# Patient Record
Sex: Female | Born: 1985 | Race: White | Hispanic: No | Smoking: Never smoker
Health system: Southern US, Community
[De-identification: ages and names within clinical notes are randomized; demographics above are authoritative.]

## PROBLEM LIST (undated history)

## (undated) DIAGNOSIS — E282 Polycystic ovarian syndrome: Secondary | ICD-10-CM

## (undated) DIAGNOSIS — I10 Essential (primary) hypertension: Secondary | ICD-10-CM

## (undated) DIAGNOSIS — J45909 Unspecified asthma, uncomplicated: Secondary | ICD-10-CM

## (undated) HISTORY — PX: TONSILLECTOMY: SUR1361

## (undated) HISTORY — PX: TUBAL LIGATION: SHX77

## (undated) HISTORY — PX: ADENOIDECTOMY: SUR15

## (undated) HISTORY — PX: TYMPANOSTOMY TUBE PLACEMENT: SHX32

---
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
## 2007-06-26 IMAGING — CT CT HEAD WITHOUT CONTRAST
2 series · 16 of 30 positions shown, 20 images · non-contrast
Comparison: none

REASON FOR EXAM: fall/head
COMMENTS:

[Series 2: without · axial · non-contrast · 0.42mm/px · z∈[-161,-41]mm · 13 of 28 slices shown, 17 images]
[im 2/28  brain]
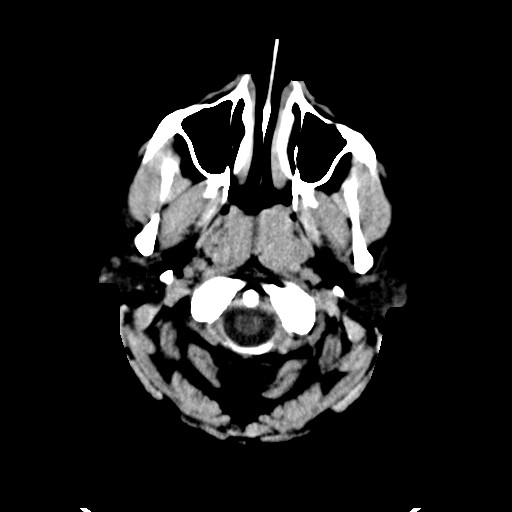
[im 2/28  bone]
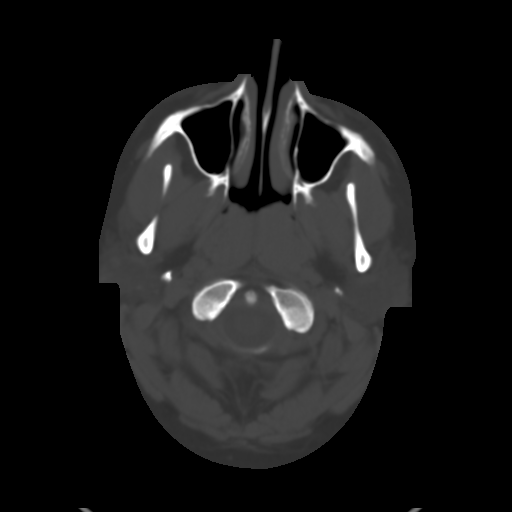
[im 4/28  brain]
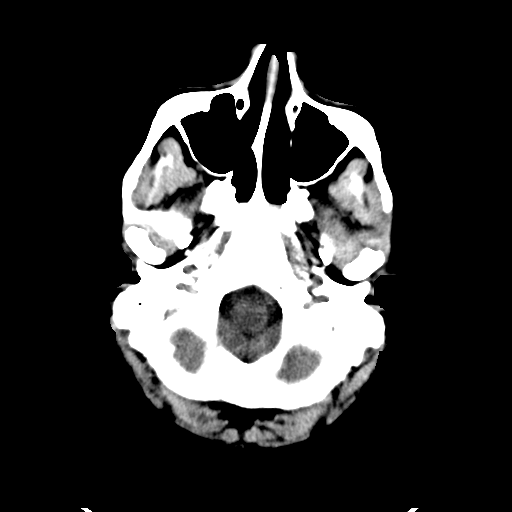
[im 6/28  brain]
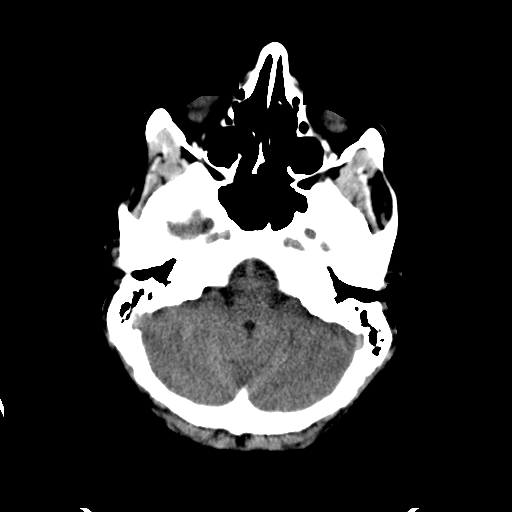
[im 8/28  brain]
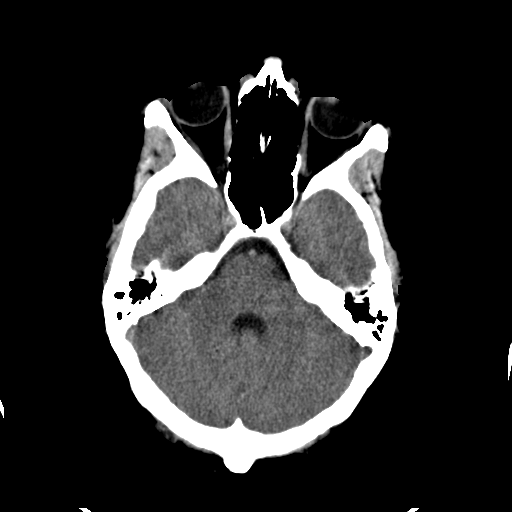
[im 10/28  brain]
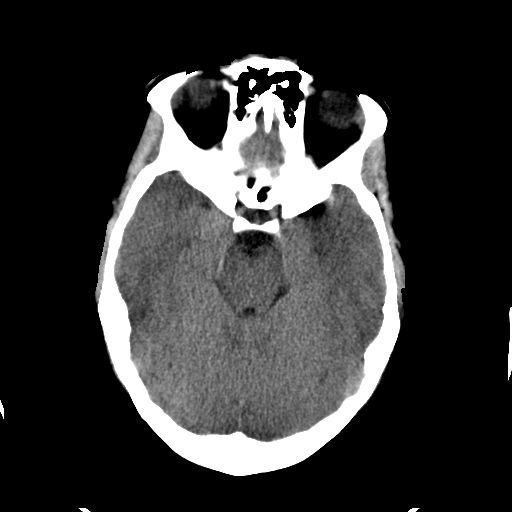
[im 10/28  bone]
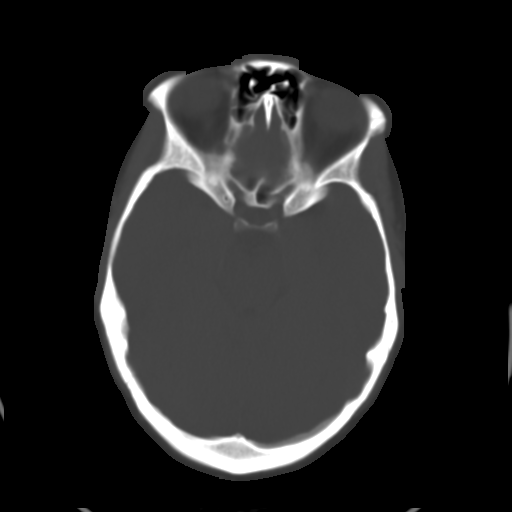
[im 12/28  brain]
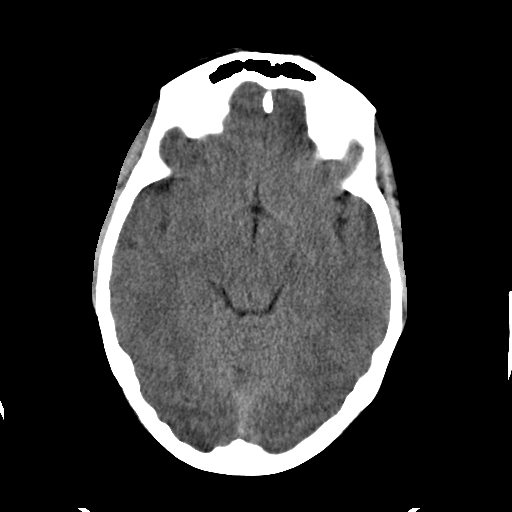
[im 14/28  brain]
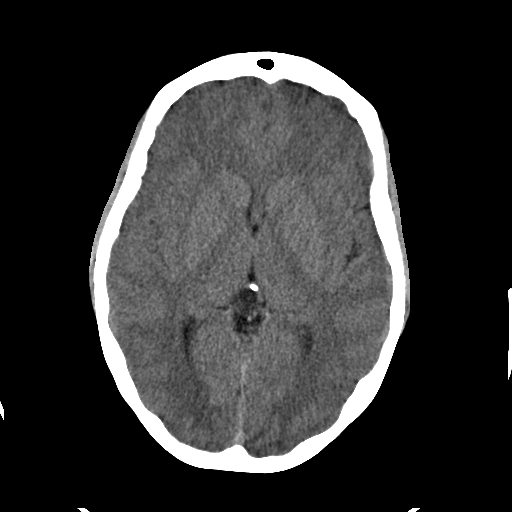
[im 16/28  brain]
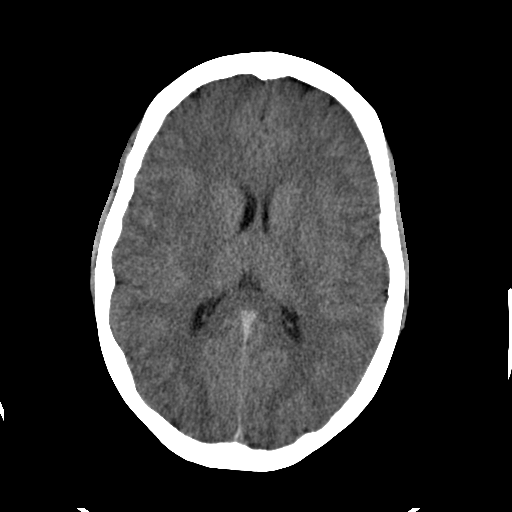
[im 18/28  brain]
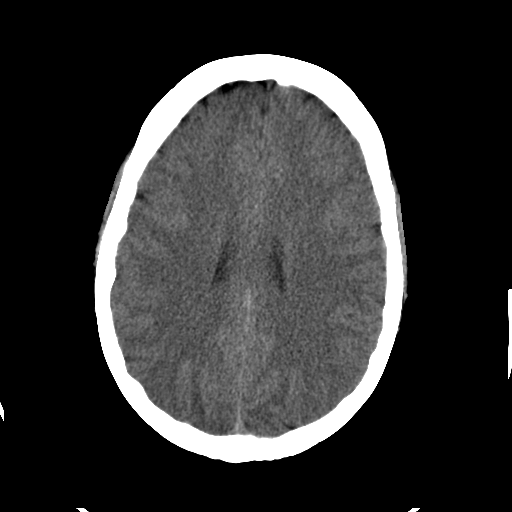
[im 18/28  bone]
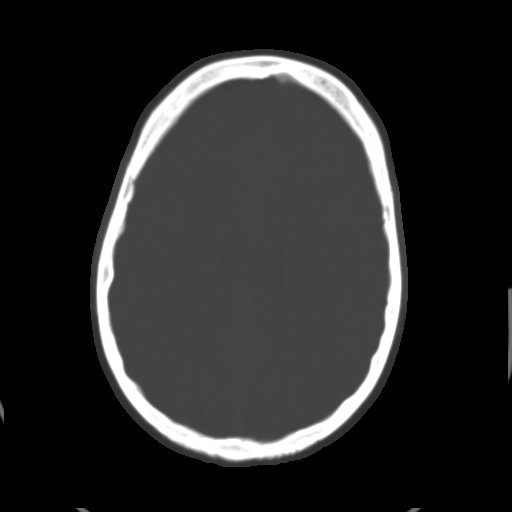
[im 20/28  brain]
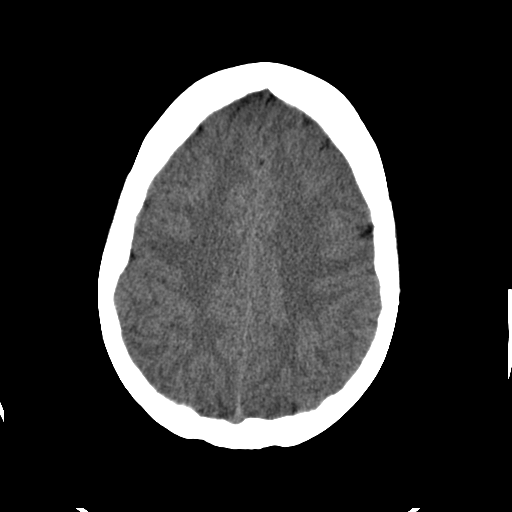
[im 22/28  brain]
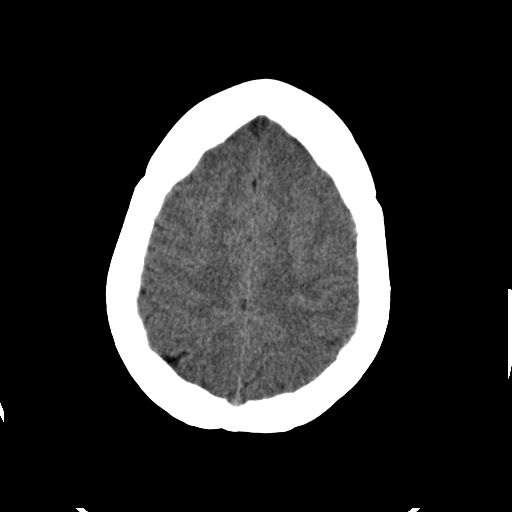
[im 24/28  brain]
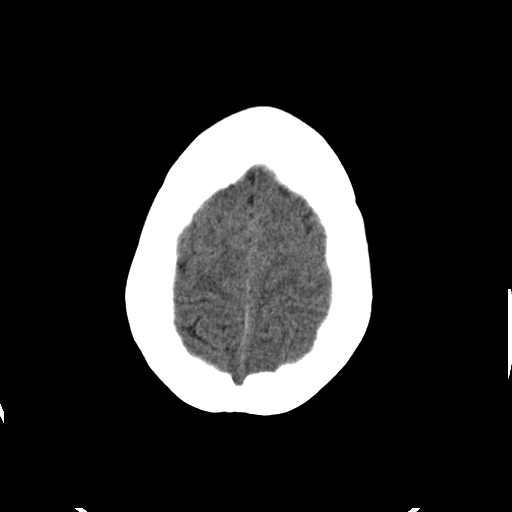
[im 26/28  brain]
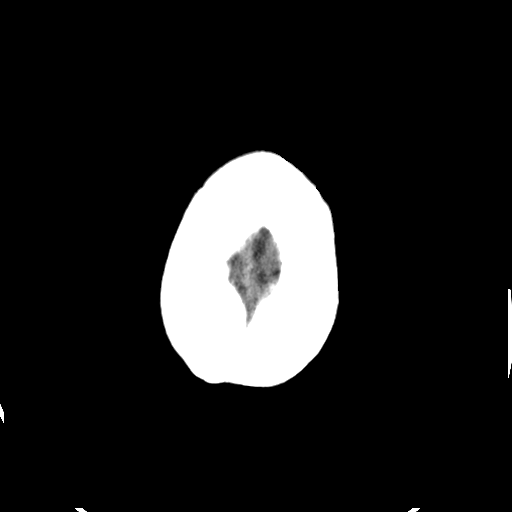
[im 26/28  bone]
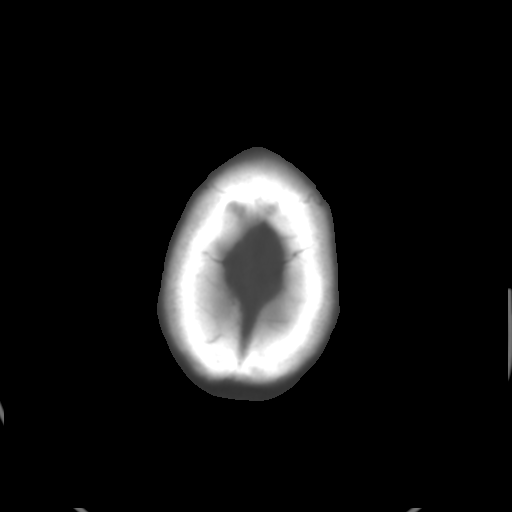

[Series 3: bone · axial · 0.42mm/px · z∈[-161,-121]mm · 3 of 28 slices shown]
[im 2/28  bone]
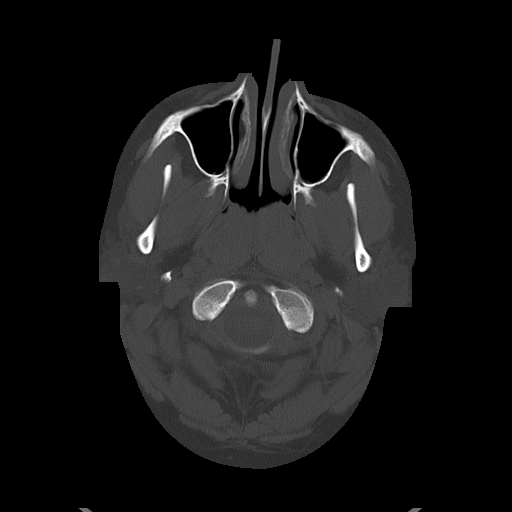
[im 6/28  bone]
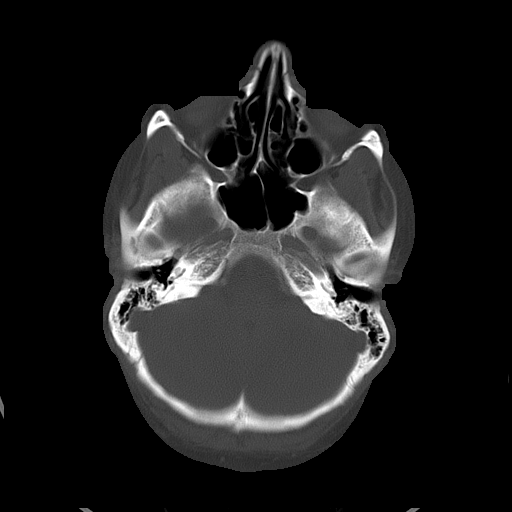
[im 10/28  bone]
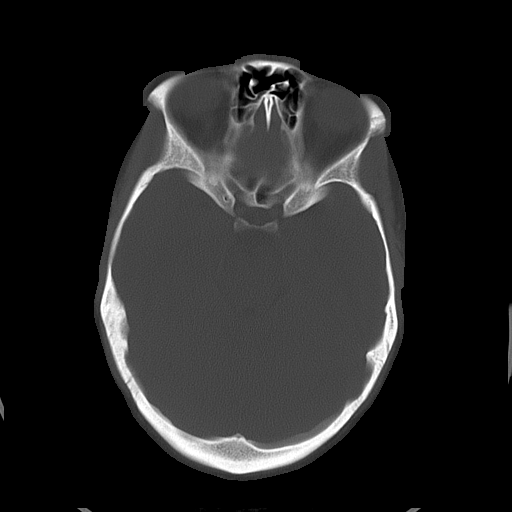

[16 of 30 positions shown; findings below may reference images not displayed]

PROCEDURE:     CT  - CT HEAD WITHOUT CONTRAST  - May 03, 2006  [DATE]

RESULT:          An unenhanced emergent head CT scan was performed status
post trauma.

No intracerebral bleeds, no contusions.  No mass effect, no shift of the
midline.  The ventricles appear within normal limits.  No subdural hematoma.

On the bone window settings, no obvious fractures are identified.
IMPRESSION: No acute abnormality is identified on the unenhanced
emergent head CT.

The report was called to the emergency room at the conclusion of the
dictation.

## 2008-11-23 ENCOUNTER — Emergency Department: Payer: Self-pay | Admitting: Emergency Medicine

## 2009-01-28 ENCOUNTER — Observation Stay: Payer: Self-pay | Admitting: Unknown Physician Specialty

## 2009-02-06 ENCOUNTER — Inpatient Hospital Stay: Payer: Self-pay | Admitting: Obstetrics and Gynecology

## 2009-03-20 ENCOUNTER — Emergency Department: Payer: Self-pay | Admitting: Unknown Physician Specialty

## 2009-07-20 ENCOUNTER — Emergency Department: Payer: Self-pay | Admitting: Emergency Medicine

## 2009-07-26 ENCOUNTER — Emergency Department: Payer: Self-pay | Admitting: Emergency Medicine

## 2009-10-29 ENCOUNTER — Emergency Department: Payer: Self-pay | Admitting: Emergency Medicine

## 2011-10-06 ENCOUNTER — Emergency Department: Payer: Self-pay | Admitting: *Deleted

## 2012-06-23 ENCOUNTER — Emergency Department: Payer: Self-pay | Admitting: Emergency Medicine

## 2012-06-23 LAB — URINALYSIS, COMPLETE
Bilirubin,UR: NEGATIVE
Glucose,UR: NEGATIVE mg/dL (ref 0–75)
Nitrite: NEGATIVE
Specific Gravity: 1.026 (ref 1.003–1.030)
Squamous Epithelial: 33
WBC UR: 13 /HPF (ref 0–5)

## 2012-06-23 LAB — CBC WITH DIFFERENTIAL/PLATELET
Basophil #: 0 10*3/uL (ref 0.0–0.1)
HCT: 42.1 % (ref 35.0–47.0)
HGB: 14.5 g/dL (ref 12.0–16.0)
Lymphocyte #: 1.2 10*3/uL (ref 1.0–3.6)
Lymphocyte %: 7.3 %
MCH: 28.9 pg (ref 26.0–34.0)
MCV: 84 fL (ref 80–100)
Monocyte #: 0.4 x10 3/mm (ref 0.2–0.9)
Monocyte %: 2.4 %
Platelet: 353 10*3/uL (ref 150–440)
RBC: 5.01 10*6/uL (ref 3.80–5.20)
RDW: 13.5 % (ref 11.5–14.5)
WBC: 16.6 10*3/uL — ABNORMAL HIGH (ref 3.6–11.0)

## 2012-06-23 LAB — BASIC METABOLIC PANEL
Chloride: 109 mmol/L — ABNORMAL HIGH (ref 98–107)
EGFR (Non-African Amer.): >60
Glucose: 110 mg/dL — ABNORMAL HIGH (ref 65–99)
Osmolality: 279 (ref 275–301)
Potassium: 4 mmol/L (ref 3.5–5.1)

## 2012-06-23 LAB — PREGNANCY, URINE: Pregnancy Test, Urine: NEGATIVE m[IU]/mL

## 2013-04-21 ENCOUNTER — Emergency Department: Payer: Self-pay | Admitting: Emergency Medicine

## 2013-04-21 LAB — CBC
HCT: 41.7 % (ref 35.0–47.0)
MCH: 28.8 pg (ref 26.0–34.0)
Platelet: 366 10*3/uL (ref 150–440)
RBC: 4.96 10*6/uL (ref 3.80–5.20)
RDW: 13.6 % (ref 11.5–14.5)
WBC: 9.2 10*3/uL (ref 3.6–11.0)

## 2013-04-21 LAB — URINALYSIS, COMPLETE
Blood: NEGATIVE
Ketone: NEGATIVE
Nitrite: NEGATIVE
Protein: NEGATIVE
RBC,UR: 1 /HPF (ref 0–5)
Squamous Epithelial: 3

## 2013-04-21 LAB — COMPREHENSIVE METABOLIC PANEL
Albumin: 3.6 g/dL (ref 3.4–5.0)
Alkaline Phosphatase: 83 U/L (ref 50–136)
BUN: 9 mg/dL (ref 7–18)
Bilirubin,Total: 0.5 mg/dL (ref 0.2–1.0)
Chloride: 107 mmol/L (ref 98–107)
Co2: 27 mmol/L (ref 21–32)
EGFR (African American): >60
EGFR (Non-African Amer.): >60
Osmolality: 273 (ref 275–301)
SGOT(AST): 16 U/L (ref 15–37)
SGPT (ALT): 23 U/L (ref 12–78)
Sodium: 137 mmol/L (ref 136–145)

## 2013-04-21 LAB — LIPASE, BLOOD: Lipase: 123 U/L (ref 73–393)

## 2013-08-11 ENCOUNTER — Emergency Department: Payer: Self-pay | Admitting: Emergency Medicine

## 2013-09-06 ENCOUNTER — Emergency Department: Payer: Self-pay | Admitting: Emergency Medicine

## 2014-07-25 ENCOUNTER — Emergency Department: Payer: Self-pay | Admitting: Emergency Medicine

## 2014-07-25 LAB — COMPREHENSIVE METABOLIC PANEL
ALBUMIN: 2.9 g/dL — AB (ref 3.4–5.0)
ALK PHOS: 58 U/L
AST: 18 U/L (ref 15–37)
Anion Gap: 11 (ref 7–16)
BUN: 7 mg/dL (ref 7–18)
Bilirubin,Total: 0.5 mg/dL (ref 0.2–1.0)
CO2: 20 mmol/L — AB (ref 21–32)
Calcium, Total: 9.7 mg/dL (ref 8.5–10.1)
Chloride: 109 mmol/L — ABNORMAL HIGH (ref 98–107)
Creatinine: 0.76 mg/dL (ref 0.60–1.30)
EGFR (African American): >60
Glucose: 101 mg/dL — ABNORMAL HIGH (ref 65–99)
Osmolality: 278 (ref 275–301)
POTASSIUM: 4 mmol/L (ref 3.5–5.1)
SGPT (ALT): 17 U/L
SODIUM: 140 mmol/L (ref 136–145)
Total Protein: 6.7 g/dL (ref 6.4–8.2)

## 2014-07-25 LAB — CBC
HCT: 38.6 % (ref 35.0–47.0)
HGB: 12.6 g/dL (ref 12.0–16.0)
MCH: 28 pg (ref 26.0–34.0)
MCHC: 32.7 g/dL (ref 32.0–36.0)
MCV: 86 fL (ref 80–100)
Platelet: 285 10*3/uL (ref 150–440)
RBC: 4.5 10*6/uL (ref 3.80–5.20)
RDW: 13.9 % (ref 11.5–14.5)
WBC: 12.2 10*3/uL — ABNORMAL HIGH (ref 3.6–11.0)

## 2014-07-25 LAB — URINALYSIS, COMPLETE
BILIRUBIN, UR: NEGATIVE
Bacteria: NONE SEEN
GLUCOSE, UR: NEGATIVE mg/dL (ref 0–75)
Nitrite: NEGATIVE
Ph: 5 (ref 4.5–8.0)
Protein: 75
SPECIFIC GRAVITY: 1.02 (ref 1.003–1.030)
Squamous Epithelial: 65
WBC UR: 26 /HPF (ref 0–5)

## 2014-07-27 ENCOUNTER — Emergency Department: Payer: Self-pay | Admitting: Emergency Medicine

## 2014-07-27 LAB — URINALYSIS, COMPLETE
BILIRUBIN, UR: NEGATIVE
BLOOD: NEGATIVE
GLUCOSE, UR: NEGATIVE mg/dL (ref 0–75)
Nitrite: NEGATIVE
PROTEIN: NEGATIVE
Ph: 5 (ref 4.5–8.0)
Specific Gravity: 1.017 (ref 1.003–1.030)
Squamous Epithelial: 11
WBC UR: 3 /HPF (ref 0–5)

## 2014-09-20 ENCOUNTER — Ambulatory Visit: Payer: Self-pay | Admitting: Obstetrics and Gynecology

## 2014-12-28 ENCOUNTER — Inpatient Hospital Stay: Payer: Self-pay | Admitting: Obstetrics and Gynecology

## 2014-12-28 LAB — URINALYSIS, COMPLETE
BILIRUBIN, UR: NEGATIVE
Blood: NEGATIVE
Glucose,UR: NEGATIVE mg/dL (ref 0–75)
KETONE: NEGATIVE
Leukocyte Esterase: NEGATIVE
Nitrite: NEGATIVE
PH: 6 (ref 4.5–8.0)
Protein: 100
RBC,UR: 2 /HPF (ref 0–5)
SPECIFIC GRAVITY: 1.016 (ref 1.003–1.030)
WBC UR: 9 /HPF (ref 0–5)

## 2014-12-28 LAB — PIH PROFILE
ANION GAP: 6 — AB (ref 7–16)
BUN: 8 mg/dL
CREATININE: 0.63 mg/dL
Calcium, Total: 9.3 mg/dL
Chloride: 110 mmol/L
Co2: 23 mmol/L
EGFR (African American): >60
Glucose: 97 mg/dL
HCT: 26.1 % — AB (ref 35.0–47.0)
HGB: 7.9 g/dL — AB (ref 12.0–16.0)
MCH: 21.3 pg — AB (ref 26.0–34.0)
MCHC: 30.1 g/dL — AB (ref 32.0–36.0)
MCV: 71 fL — ABNORMAL LOW (ref 80–100)
Platelet: 269 10*3/uL (ref 150–440)
Potassium: 3.6 mmol/L
RBC: 3.69 10*6/uL — ABNORMAL LOW (ref 3.80–5.20)
RDW: 17.3 % — ABNORMAL HIGH (ref 11.5–14.5)
SGOT(AST): 14 U/L — ABNORMAL LOW
Sodium: 139 mmol/L
Uric Acid: 7.6 mg/dL — ABNORMAL HIGH
WBC: 6.8 10*3/uL (ref 3.6–11.0)

## 2014-12-28 LAB — PROTEIN / CREATININE RATIO, URINE
Creatinine, Urine: 171 mg/dL — ABNORMAL HIGH (ref 30.0–125.0)
Protein, Random Urine: 86 mg/dL — ABNORMAL HIGH (ref 0–12)
Protein/Creat. Ratio: 503 mg/gCREAT — ABNORMAL HIGH (ref 0–200)

## 2014-12-29 LAB — BASIC METABOLIC PANEL WITH GFR
Anion Gap: 5 — ABNORMAL LOW
BUN: 6 mg/dL
Calcium, Total: 8 mg/dL — ABNORMAL LOW
Chloride: 107 mmol/L
Co2: 22 mmol/L
Creatinine: 0.61 mg/dL
EGFR (African American): >60
EGFR (Non-African Amer.): >60
Glucose: 100 mg/dL — ABNORMAL HIGH
Potassium: 3.9 mmol/L
Sodium: 134 mmol/L — ABNORMAL LOW

## 2014-12-29 LAB — SGOT (AST)(ARMC)
SGOT(AST): 17 U/L
SGOT(AST): 16 U/L

## 2014-12-29 LAB — BASIC METABOLIC PANEL
Anion Gap: 6 — ABNORMAL LOW (ref 7–16)
BUN: 7 mg/dL
CALCIUM: 8.1 mg/dL — AB
Chloride: 105 mmol/L
Co2: 22 mmol/L
Creatinine: 0.65 mg/dL
EGFR (African American): >60
EGFR (Non-African Amer.): >60
Glucose: 100 mg/dL — ABNORMAL HIGH
Potassium: 3.6 mmol/L
Sodium: 133 mmol/L — ABNORMAL LOW

## 2014-12-29 LAB — PLATELET COUNT
Platelet: 273 x10 3/mm 3
Platelet: 286 10*3/uL (ref 150–440)

## 2014-12-29 LAB — URIC ACID
Uric Acid: 7.6 mg/dL — ABNORMAL HIGH
Uric Acid: 7.6 mg/dL — ABNORMAL HIGH

## 2014-12-29 LAB — RBC
RBC: 3.74 10*6/uL — AB (ref 3.80–5.20)
RBC: 3.85 10*6/uL (ref 3.80–5.20)

## 2014-12-29 LAB — WBC
WBC: 11 x10 3/mm 3
WBC: 11.5 10*3/uL — AB (ref 3.6–11.0)

## 2014-12-29 LAB — CREATININE, URINE, RANDOM: Creatinine, Urine Random: 91 mg/dL (ref 30–125)

## 2014-12-29 LAB — HEMATOCRIT: HCT: 26.2 % — AB (ref 35.0–47.0)

## 2014-12-29 LAB — HEMOGLOBIN: HGB: 7.9 g/dL — AB (ref 12.0–16.0)

## 2014-12-30 LAB — BETA STREP CULTURE(ARMC)

## 2015-01-02 LAB — COMPREHENSIVE METABOLIC PANEL
ALK PHOS: 85 U/L
ALT: 18 U/L
Albumin: 2.1 g/dL — ABNORMAL LOW
Anion Gap: 8 (ref 7–16)
BILIRUBIN TOTAL: 0.2 mg/dL — AB
BUN: 10 mg/dL
CREATININE: 0.54 mg/dL
Calcium, Total: 9.2 mg/dL
Chloride: 106 mmol/L
Co2: 25 mmol/L
EGFR (African American): >60
EGFR (Non-African Amer.): >60
Glucose: 83 mg/dL
Potassium: 3.8 mmol/L
SGOT(AST): 20 U/L
Sodium: 139 mmol/L
Total Protein: 5.5 g/dL — ABNORMAL LOW

## 2015-01-29 LAB — SURGICAL PATHOLOGY

## 2015-02-04 NOTE — Consult Note (Signed)
Brief Consult Note: Diagnosis: 29 yr old female with preeclampsia.has urgent C section ,now has Post partum  hypertension,despite 2 antihypertensives.   Patient was seen by consultant.   Consult note dictated.   Recommend further assessment or treatment.   Orders entered.   Comments: 29 yr old morbidly obese female with preeclampsia,developed post partum hypertension;had emergency C section  on 25th.having elevated bp with 180-/100.no evidence of HELp or renal failure.no headache or pedal edema. recommend continuing same dose labetalol,increased Norvasc,added Hydrallazine. recommend d/c fluids ,NSAID in pt with (post partum Hypertension,can increase stroke risk) can progress to chronic hypertension,needs close follo up,.  Electronic Signatures: Katha HammingKonidena, Lilliona Blakeney (MD)  (Signed 28-Mar-16 18:22)  Authored: Brief Consult Note   Last Updated: 28-Mar-16 18:22 by Katha HammingKonidena, Sutton Plake (MD)

## 2015-02-04 NOTE — Op Note (Signed)
PATIENT NAME:  Alicia Blevins, Alicia Blevins MR#:  [id number] DATE OF BIRTH:  17-Jun-1986  DATE OF PROCEDURE:  12/29/2014  PREOPERATIVE DIAGNOSES:  1.  Previous Cesarean section x 1, desiring repeat.  2.  Multiparity, desiring permanent sterilization.  3.  Morbid obesity.  4.  Severe preeclampsia.  5.  Twin gestation, dichorionic, diamnionic at 41 and [redacted] weeks gestation.  6.  Severe anemia with pre-operative hemoglobin of 7.9.   POSTOPERATIVE DIAGNOSES:  1.  Previous Cesarean section x 1, desiring repeat.  2.  Multiparity, desiring permanent sterilization.  3.  Morbid obesity.  4.  Severe preeclampsia.  5.  Twin gestation, dichorionic, diamnionic at 55 and [redacted] weeks gestation.  6.  Severe anemia with pre-operative hemoglobin of 7.9.   PROCEDURE:  Repeat low transverse Cesarean section with bilateral tubal ligation.   ANESTHESIA: Epidural.   SURGEON:  Lucette Kratz S. Valentino Saxon, MD.    Threasa HeadsPrentice Docker. DeFrancesco, MD.   Bronwen Betters BLOOD LOSS: 500 mL.   OPERATIVE FLUIDS: 1200 mL.   URINE OUTPUT: 20 mL.    COMPLICATIONS: None.   FINDINGS:  There was patient with large pannus with faint previous C-section scar. There was a thick scar band at the level of fascia.  There were a few filmy adhesions of the peritoneum to the uterus.  There was a thin lower uterine segment. Twin A was female, cephalic, and ROA position with nuchal cord that was loose and reducible, at 2510 grams, with Apgars of 8 and 9, and twin B was footling breech with nuchal cord, 2500 grams, with Apgars of 9 and 9, also with nuchal cord that was loose and reducible. There was clear amniotic fluid at rupture x 2. There was normal-appearing uterine outline, fallopian tubes and ovaries.   SPECIMEN: There were none.   CONDITION:  Stable.   DESCRIPTION OF PROCEDURE:  The patient was taken to the operating room where she was placed under epidural anesthesia after there were difficulties with obtaining spinal anesthesia. She was then placed in  the supine position with leftward tilt and was prepped and draped in normal sterile fashion. A Foley catheter was placed. The epidural was then tested and found to be adequate. 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 The patient received approximately 3 grams of clindamycin as she had an allergy to penicillin.  A Pfannenstiel incision was made and carried down through the subcutaneous tissue to the fascia with the Bovie. The fascia was then incised in the midline and the incision was extended laterally using the Bovie. The superior aspect of the fascial incision was then grasped with the Kocher clamps, elevated, and underlying rectus tissue was dissected superiorly with the scalpel. Attention was then turned inferiorly, which in a similar fashion was grasped, tented up with Kocher clamps, and the rectus muscles were dissected off using the Mayo scissors. There was no clearly identifiable midline that was noted in the rectus muscles, therefore the belly of the muscle was entered in what was presumed to be the midline. The peritoneum was identified, tented up, and entered sharply with Metzenbaum scissors. The peritoneal incision was then extended superiorly and inferiorly with good visualization of the bladder. There were a few filmy adhesions of the peritoneum to the uterus which were taken down sharply. Next the uterovesical peritoneal reflection was incised transversely and the bladder flap was bluntly freed from the lower uterine segment. The bladder blade was then  inserted. A low transverse uterine incision was made along the lower uterine segment with the scalpel. The lower uterine segment was noted to be very thin, almost a peritoneal window. The uterine incision was then extended laterally and superiorly bluntly. The bladder blade was removed. The amniotic sac was ruptured and the infant's head was  delivered atraumatically. There was a nuchal cord that was reduced. Nose and mouth were suctioned, the infant was handed off to the pediatricians. Delivered from vertex presentation was a 2510 gram female with Apgars of 8 and 9 at 1 and 5 minutes respectively. After the umbilical cord was clamped and cut the second amniotic sac was ruptured and the twin B was delivered atraumatically via breech extraction as the infant was noted to be footling breech. Twin B was 2500 grams with Apgars of 9 and 9 at 1 minute and 5 minutes respectively. The umbilical cord of twin B was then clamped and cut. Next the placenta was removed manually and intact and appeared normal. The uterus was exteriorized and cleared of all clots and debris. Uterine outline, tubes and ovaries appeared normal. The uerine incision was then closed with a running locked suture of 0 Vicryl. There was still some oozing noted from the bladder flap and so the bladder flap was then reapproximated using a 2-0 Vicryl. Hemostasis was achieved.   Attention was turned to the right fallopian tube which was then identified and grasped with Babcock clamp. The tube was then followed out to the fimbriated edge. The Babcock clamp was then used to grasp the tube approximately 4 cm from the cornual region. A 3 cm segment of tube was then ligated with a free tie of plain gut using the Parkland technique and excised. The left fallopian tube was then ligated in a similar fashion and excised. Next the uterus was returned to the abdomen. The gutters were cleared of all clots and debris. Lavage was carried out until clear.    the trocars for the On-Q pumps were inserted through a small skin incision that traversed from the skin through the level of the fascia, and the tubing was placed lateral to each side of the incision. The fascia was then closed with running sutures of 1-0 Vicryl from both directions. The subcutaneous fat was reapproximated in 2 layers using 3-0 Vicryl in  running fashion. The skin was then approximated using 4-0 Monocryl. The patient received a bolus dose of 5 mL of bupivacaine in each tubing that was placed on the left and right sides of the incision.  The patient tolerated the procedure well. She was taken to the PACU in stable condition. Both infants were taken to the recovery room as well.  Instrument, sponge, and needle counts were correct x 2 at the end of the procedure. The patient will receive 2 more doses of clindamycin postpartum q 8 hours for 24 hours due to morbid obesity and greater than 1 hour passing from dose to incision time. She will be started on magnesium sulfate postpartum 4 gram load and 2 gram maintenance.    ____________________________ Jacques EarthlyAnika S. Valentino Saxonherry, MD asc:bu D: 12/29/2014 10:45:02 ET T: 12/29/2014 14:37:35 ET JOB#: [id number]  cc: Jacques EarthlyAnika S. Valentino Saxonherry, MD, <Dictator> Fabian NovemberANIKA S Janequa Kipnis MD ELECTRONICALLY SIGNED 01/15/2015 12:28

## 2015-02-04 NOTE — Consult Note (Addendum)
PATIENT NAME:  Alicia Blevins, Alicia Blevins MR#:  [id number] DATE OF BIRTH:  January 28, 1986  DATE OF CONSULTATION:  01/01/2015  REFERRING PHYSICIAN:  Anika S. Valentino Saxon, MD  CONSULTING PHYSICIAN:  Katha Hamming, MD  PRIMARY DOCTOR:  Nonlocal.    REASON FOR CONSULT:  Management of hypertension.    HISTORY OF PRESENT ILLNESS: A 29 year old female who was gravida 2, para 1 admitted to GYN service for preeclampsia and (post partum hypertension.. The patient's blood pressure in the office was 160s/100 with proteinuria so patient (is  admitted   OB/GYN service, had emergency cesarean section on March 25. The patient had severe preeclampsia, and she received labetalol and magnesium and then had emergency  cesarean section. However, patient had postpartum hypertension, persisted, and patient's blood pressure has been elevated with recent blood pressure 186/107. The patient is on labetalol at 400 mg b.i.d. and amlodipine 5 mg daily but patient's blood pressure has been high at 160/100. Concerning this we were asked to see the patient. The patient never had hypertension before and had preeclampsia and was evaluated at GYN office and admitted and then had emergency cesarean section. The patient has no end-organ damage at this time with no HELLP syndrome, no renal failure. The patient has very poor IV access and gyn  has ordered IV hydralazine 10 mg one dose for highly elevated blood pressure of 186/107 but patient is very anxious and she says she gets panic attacks whenever IV is inserted and she does not want IV to be placed, so I ordered hydralazine 50 mg t.i.d. in addition to her labetalol and Norvasc. The patient denies chest pain, shortness of breath, headache. No extremity edema.   PAST MEDICAL HISTORY: No hypertension, diabetes. The patient is obese with BMI of 54.8. The patient has a healthy child who is 51 years old now.    ALLERGIES: AMOXICILLIN, PENICILLIN, TRAMADOL.   SOCIAL HISTORY: Lives with husband. No  smoking, no drinking.   SURGICAL HISTORY: Cesarean section before and this is second cesarean.   FAMILY HISTORY: Mother has high blood pressure, grandmother has high blood pressure.   PRESENT MEDICATIONS: Labetalol 400 mg b.i.d., amlodipine 5 mg daily, hydralazine 50 mg t.i.d. The patient is on prenatal vitamins and Milk of Magnesia as needed for constipation, , Toradol 30 mg IV q. 6 for pain, Zofran and also ibuprofen 800 mg q. 8 p.r.n. for mild pain. The patient is also on ringer lactate at 50 mL per hour.   REVIEW OF SYSTEMS:  CONSTITUTIONAL: No fever. No fatigue.  EYES: No blurred vision.  EARS, NOSE, AND THROAT: No tinnitus. No ear pain. No epistaxis.   RESPIRATIONS: No cough. No shortness of breath.  CARDIOVASCULAR: No chest pain. No orthopnea. No pedal edema.  GASTROINTESTINAL: No nausea, no vomiting. Has some abdominal cramps on and off.  GENITOURINARY: No dysuria.  ENDOCRINE: No polyuria or nocturia.  HEMATOLOGIC: The patient has no chronic anemia problems.  MUSCULOSKELETAL: No joint pain.  NEUROLOGIC: No numbness or weakness or dysarthria.  PSYCHIATRIC: The patient is anxious because of her IV access but otherwise no problems.   LABORATORY DATA: Glucose 100, BUN 6, creatinine , glucose 134, potassium 3.9, chloride 107, bicarbonate 22. The patient's calcium 8, uric acid 7.6, SGOT 16. The patient's platelets are 286,000. These labs are done on 25th.    PHYSICAL EXAMINATION:  VITALS: Temperature 98.2, heart rate 78, blood pressure fluctuating between 160/100 to 186/107. The patient's blood pressure had been within acceptable limits yesterday but it has been  high since this afternoon.   HEENT:  PERRLA. EOMs intact. No scleral icterus. No conjunctivitis. Hearing is intact. No pharyngeal erythema.  NECK:  No thyroid enlargement. No JVD. No carotid bruit. Normal range of motion.  RESPIRATION: Clear to auscultation. Breath sounds are present bilaterally.  CARDIOVASCULAR: S1, S2  regular. No murmurs. The patient has good pedal pulse >. No extremity edema.  ABDOMEN: Soft. Bowel sounds present.  EXTREMITIES: No extremity edema. No cyanosis. No clubbing.  SKIN: No skin rashes. No erythema, no nodules.  MUSCULOSKELETAL: Five/5 strength upper and lower extremities. No kyphosis.   NEUROLOGIC: Cranial nerves II through XII are intact. DTRs 2+ bilaterally. No ( focal deficit. or aphasia.  PSYCHIATRIC: Oriented to time, place, person.   ASSESSMENT AND PLAN:  411.  A 29 year old female patient with severe preeclampsia now has postpartum hypertension. She is already on p.o. labetalol and Norvasc and the patient's blood pressure is still uncontrolled. She has no end-organ damage at this time. I recommend continuing the labetalol at 400 mg b.i.d., continue amlodipine but we can increase amlodipine to 10 mg daily, plus add the hydralazine 50 mg t.i.d. These medications are safe even for breast feeding, so we will up titrate the medications as needed.  2.  Regarding fluid status, the patient is well hydrated. Recommend discontinuing fluids, recommend discontinuing iV fluids with RL.. The patient's  goal blood pressure is about 140 to 150/90. The patient needs close follow up to see if she develops any chronic hypertension.  3.  Morbid obesity, (counselled  against > weight loss to help her with hypertension.  The patient can have a small dose of diuretic to help her with high blood pressure if needed, can have hydrochlorothiazide 25 mg daily for a few days to help with postpartum hypertension.   TIME SPENT: About 55 minutes.   Thanks for asking us to see this patient. We will follow up on the blood pressure.    ____________________________ Katha HammingSnehalatha Terryon Pineiro, MD sk:AT D: 01/01/2015 18:10:00 ET T: 01/01/2015 23:53:45 ET JOB#: [id number]  cc: Katha HammingSnehalatha Sabreena Vogan, MD, <Dictator> Katha HammingSNEHALATHA Makailey Hodgkin MD ELECTRONICALLY SIGNED 02/06/2015 12:36

## 2015-02-13 NOTE — H&P (Signed)
L&D Evaluation:  History:  HPI [name] is a 29 y.o. G2P1001 single Caucasian female with di-di twin gestation who presented for workup of PIH.  BP in office 160s/100s, with 4+ protenuria on UA dipstick. Denies headache, visual changes, abdominal pain.  Does report leg swelling over past 2-3 days.   Presents with elevated BPs   Patient's Medical History Migraines, morbid obesity,   Patient's Surgical History Previous C-Section  x 1   Medications Pre Natal Vitamins  Iron  Percocet (for migraines) prn.   Allergies PCN, Tramadol, red dye   Social History tobacco  h/o smoking prior to pregnancy   Family History migraines in several family members   ROS:  General normal   HEENT normal   CNS normal   GI normal   GU normal   Resp normal   CV normal   Renal normal   MS bilateral swelling in feet x 2-3 days   Exam:  Vital Signs BPs 140s-170s/90s-100s   Urine Protein 3+   General no apparent distress   Mental Status clear   Chest clear   Heart normal sinus rhythm, no murmur/gallop/rubs   Abdomen gravid, non-tender   Estimated Fetal Weight Average for gestational age   Fetal Position cephalic   Fundal Height 39   Back no CVAT   Edema 1+  Pitting  in ankles and feet   Reflexes 2+   Clonus negative   Pelvic no external lesions, cervix closed and thick   Mebranes Intact   FHT normal rate with no decels   FHT Description Twin A 145, Twin B 152   Ucx irregular, q 3-10 min, not detectable to patient.   Skin dry, no lesions, no rashes   Lymph no lymphadenopathy   Other A+/-/HIV-/ND/NR/RI/VZI/GBS unknown.  GC/Cl to be performed today, however on NOB labs was negative.   Early and repeat glucola wnl. [facility] genetic testing normal.  12/28/14: 6.8>7.9/26.1<269   Impression:  Impression evaluation for PIH, reactive NST, Twin gestatio, morbid obesity, h/o C-section x 1, severe anemia of pregnancy   Plan:  Plan PIH panel, fluids    Comments Patient with severe range BPs, proteinuria.  Current diagnosis of pre-eclampsia with severe features.  Currently without headaches, vision changes or pain.  Liver enzymes and platelets wnl, uric acid is 7.6.  PC ratio is 503.  Discussion had with patient regarding diagnosis and need for delivery.  Patient ok with plan.  Will initiate Labetalol for BPs, can begin Magnesium Sulfate post-operatively as will be proceeding to C-section within the next hour. Anemia in pregnancy - severe.  Will type and cross for 3 units PRBCs as patient may require blood transfusion postpartum. To collect thrid trimester labs.   Electronic Signatures: Fabian Novemberherry, Star Cheese S (MD)  (Signed 24-Mar-16 18:10)  Authored: L&D Evaluation   Last Updated: 24-Mar-16 18:10 by Fabian Novemberherry, Stanton Kissoon S (MD)

## 2015-04-05 ENCOUNTER — Emergency Department
Admission: EM | Admit: 2015-04-05 | Discharge: 2015-04-05 | Disposition: A | Discharge status: Home / Self Care | Payer: Medicaid Other | Attending: Emergency Medicine | Admitting: Emergency Medicine

## 2015-04-05 ENCOUNTER — Encounter: Payer: Self-pay | Admitting: Intensive Care

## 2015-04-05 DIAGNOSIS — I10 Essential (primary) hypertension: Secondary | ICD-10-CM | POA: Diagnosis not present

## 2015-04-05 DIAGNOSIS — Z880 Allergy status to penicillin: Secondary | ICD-10-CM | POA: Diagnosis not present

## 2015-04-05 DIAGNOSIS — L409 Psoriasis, unspecified: Secondary | ICD-10-CM | POA: Diagnosis not present

## 2015-04-05 DIAGNOSIS — L02411 Cutaneous abscess of right axilla: Secondary | ICD-10-CM | POA: Diagnosis present

## 2015-04-05 DIAGNOSIS — L732 Hidradenitis suppurativa: Secondary | ICD-10-CM | POA: Insufficient documentation

## 2015-04-05 HISTORY — DX: Unspecified asthma, uncomplicated: J45.909

## 2015-04-05 HISTORY — DX: Essential (primary) hypertension: I10

## 2015-04-05 MED ORDER — SULFAMETHOXAZOLE-TRIMETHOPRIM 800-160 MG PO TABS: 1.0000 | ORAL_TABLET | Freq: Two times a day (BID) | ORAL | Status: DC

## 2015-04-05 MED ORDER — HYDROCODONE-ACETAMINOPHEN 5-325 MG PO TABS
1.0000 | ORAL_TABLET | ORAL | Status: DC | PRN
Start: 2015-04-05 — End: 2019-09-03

## 2015-04-05 MED ORDER — CALCIPOTRIENE 0.005 % EX SOLN: 1.0000 | Freq: Two times a day (BID) | CUTANEOUS | Status: DC

## 2015-04-05 NOTE — ED Provider Notes (Signed)
[facility] Emergency Department Provider Note ____________________________________________  Time seen: Approximately 6:16 PM  I have reviewed the triage vital signs and the nursing notes.   HISTORY  Chief Complaint Abscess  HPI Alicia Blevins is a 29 y.o. female who presents to the emergency department for an abscess under her right arm. She states she has had the same area lanced about a year ago.She is also here for severe dandruff that has not been controlled with Head and Shoulders or T-Gel. She had twins about 3 months ago and everything started after that.  Past Medical History  Diagnosis Date  . Hypertension   . Asthma     There are no active problems to display for this patient.   Past Surgical History  Procedure Laterality Date  . Tubal ligation      Current Outpatient Rx  Name  Route  Sig  Dispense  Refill  . Calcipotriene 0.005 % solution   Topical   Apply 1 Dose topically 2 (two) times daily. Brush hair, apply solution to lesions and rub in. Avoid uninvolved skin. Do not use for longer than 8 weeks.   60 mL   0   . HYDROcodone-acetaminophen (NORCO/VICODIN) 5-325 MG per tablet   Oral   Take 1 tablet by mouth every 4 (four) hours as needed for moderate pain.   6 tablet   0   . sulfamethoxazole-trimethoprim (BACTRIM DS,SEPTRA DS) 800-160 MG per tablet   Oral   Take 1 tablet by mouth 2 (two) times daily.   20 tablet   0     Allergies Amoxicillin; Penicillins; Red dye; and Tramadol  History reviewed. No pertinent family history.  Social History History  Substance Use Topics  . Smoking status: Never Smoker   . Smokeless tobacco: Never Used  . Alcohol Use: No    Review of Systems   Constitutional: No fever/chills Eyes: No visual changes. ENT: No difficulty swallowing. Cardiovascular: Denies chest pain. Respiratory: Denies shortness of breath. Gastrointestinal: No abdominal pain.  No nausea, no vomiting.  No  diarrhea.  No constipation. Genitourinary: Negative for dysuria. Musculoskeletal: Negative for back pain. Skin: Tender area under right axilla. Dandruff to front of scalp. Neurological: Negative for headaches, focal weakness or numbness.  10-point ROS otherwise negative.  ____________________________________________   PHYSICAL EXAM:  VITAL SIGNS: ED Triage Vitals  Enc Vitals Group     BP 04/05/15 1748 166/98 mmHg     Pulse Rate 04/05/15 1748 110     Resp 04/05/15 1748 16     Temp 04/05/15 1748 98.3 F (36.8 C)     Temp Source 04/05/15 1748 Oral     SpO2 04/05/15 1748 100 %     Weight 04/05/15 1748 238 lb (107.956 kg)     Height 04/05/15 1748 4\' 11"  (1.499 m)     Head Cir --      Peak Flow --      Pain Score 04/05/15 1748 8     Pain Loc --      Pain Edu? --      Excl. in GC? --    Constitutional: Alert and oriented. Well appearing and in no acute distress. Eyes: Conjunctivae are normal. PERRL. EOMI. Head: Atraumatic. Nose: No congestion/rhinnorhea. Mouth/Throat: Mucous membranes are moist.  Oropharynx non-erythematous. No oral lesions. Neck: No stridor. Cardiovascular: Normal rate, regular rhythm.  Good peripheral circulation. Respiratory: Normal respiratory effort.  No retractions. Lungs CTAB. Gastrointestinal: Soft and nontender. No distention. No abdominal bruits.  Musculoskeletal: No lower extremity tenderness nor edema.  No joint effusions. Neurologic:  Normal speech and language. No gross focal neurologic deficits are appreciated. Speech is normal. No gait instability. Skin: Non-fluctuant, tender area under right axilla without cellulitis. Psoriasis appearing scaling at hairline of forehead. Psychiatric: Mood and affect are normal. Speech and behavior are normal.  ____________________________________________   LABS (all labs ordered are listed, but only abnormal results are displayed)  Labs Reviewed - No data to  display ____________________________________________  EKG   ____________________________________________  RADIOLOGY   ____________________________________________   PROCEDURES  Procedure(s) performed: None ____________________________________________   INITIAL IMPRESSION / ASSESSMENT AND PLAN / ED COURSE  Pertinent labs & imaging results that were available during my care of the patient were reviewed by me and considered in my medical decision making (see chart for details).  Patient was advised to call and schedule an appointment with the surgeon. The axilla is tender, but not fluctuant or erythematous.  She was also advised to follow up with the dermatologist for psoriasis that is not improving over 2 weeks. ____________________________________________   FINAL CLINICAL IMPRESSION(S) / ED DIAGNOSES  Final diagnoses:  Hidradenitis suppurativa of right axilla  Psoriasis of scalp       Chinita Pester, FNP 04/05/15 2321  Loleta Rose, MD 04/06/15 2109

## 2015-04-05 NOTE — Discharge Instructions (Signed)
Hidradenitis Suppurativa, Sweat Gland Abscess Hidradenitis suppurativa is a long lasting (chronic), uncommon disease of the sweat glands. With this, boil-like lumps and scarring develop in the groin, some times under the arms (axillae), and under the breasts. It may also uncommonly occur behind the ears, in the crease of the buttocks, and around the genitals.  CAUSES  The cause is from a blocking of the sweat glands. They then become infected. It may cause drainage and odor. It is not contagious. So it cannot be given to someone else. It most often shows up in puberty (about 68 to 27 years of age). But it may happen much later. It is similar to acne which is a disease of the sweat glands. This condition is slightly more common in African-Americans and women. SYMPTOMS   Hidradenitis usually starts as one or more red, tender, swellings in the groin or under the arms (axilla).  Over a period of hours to days the lesions get larger. They often open to the skin surface, draining clear to yellow-colored fluid.  The infected area heals with scarring. DIAGNOSIS  Your caregiver makes this diagnosis by looking at you. Sometimes cultures (growing germs on plates in the lab) may be taken. This is to see what germ (bacterium) is causing the infection.  TREATMENT   Topical germ killing medicine applied to the skin (antibiotics) are the treatment of choice. Antibiotics taken by mouth (systemic) are sometimes needed when the condition is getting worse or is severe.  Avoid tight-fitting clothing which traps moisture in.  Dirt does not cause hidradenitis and it is not caused by poor hygiene.  Involved areas should be cleaned daily using an antibacterial soap. Some patients find that the liquid form of Lever 2000, applied to the involved areas as a lotion after bathing, can help reduce the odor related to this condition.  Sometimes surgery is needed to drain infected areas or remove scarred tissue. Removal of  large amounts of tissue is used only in severe cases.  Birth control pills may be helpful.  Oral retinoids (vitamin A derivatives) for 6 to 12 months which are effective for acne may also help this condition.  Weight loss will improve but not cure hidradenitis. It is made worse by being overweight. But the condition is not caused by being overweight.  This condition is more common in people who have had acne.  It may become worse under stress. There is no medical cure for hidradenitis. It can be controlled, but not cured. The condition usually continues for years with periods of getting worse and getting better (remission). Document Released: 05/06/2004 Document Revised: 12/15/2011 Document Reviewed: 12/23/2013 [facility] Patient Information 2015 [place], [place]. This information is not intended to replace advice given to you by your health care provider. Make sure you discuss any questions you have with your health care provider.  Psoriasis Psoriasis is a long-lasting (chronic) skin problem. It can cause red bumps, a rash, scales that flake off, bleeding, and joint pain (arthritis). Psoriasis often affects the elbows, knees, groin, genitals, arms, legs, scalp, and nails. Psoriasis cannot be passed from person to person (not contagious).  HOME CARE  Only take medicine as told by your doctor.  Keep all doctor visits as told. You may need to try many treatments to find what works for you.  Avoid sunburn, cuts, scrapes, alcohol, smoking, and stress.  Wear gloves when you wash dishes, clean, or go outside in the cold.  Keep the air moist and cool in your  home. You can use a humidifier.  Put lotion on right after a bath or shower.  Avoid long, hot baths and showers. Do not use a lot of soap.  Drink enough fluids to keep your pee (urine) clear or pale yellow. GET HELP RIGHT AWAY IF:   You have pain in the affected areas.  You have bleeding that does not stop in the affected  areas.  You have more redness or warmth in the affected areas.  You have painful or stiff joints.  You feel depressed.  You have a fever. MAKE SURE YOU:  Understand these instructions.  Will watch your condition.  Will get help right away if you are not doing well or get worse. Document Released: 10/30/2004 Document Revised: 12/15/2011 Document Reviewed: 03/21/2011 [facility] Patient Information 2015 [place]. This information is not intended to replace advice given to you by your health care provider. Make sure you discuss any questions you have with your health care provider.

## 2015-04-05 NOTE — ED Notes (Signed)
Pt reports having abscess under her right armpit

## 2015-08-23 ENCOUNTER — Encounter: Payer: Self-pay | Admitting: Obstetrics and Gynecology

## 2015-09-17 IMAGING — US US RENAL KIDNEY
2 series · 14 of 25 positions shown · non-contrast
Comparison: None.

CLINICAL DATA: Left flank pain, hematuria.

EXAM:
RENAL/URINARY TRACT ULTRASOUND COMPLETE

[Series 1: us renal kidney · 0.24mm/px · 13 of 24 slices shown (1 of 2)]
[im 1/24]
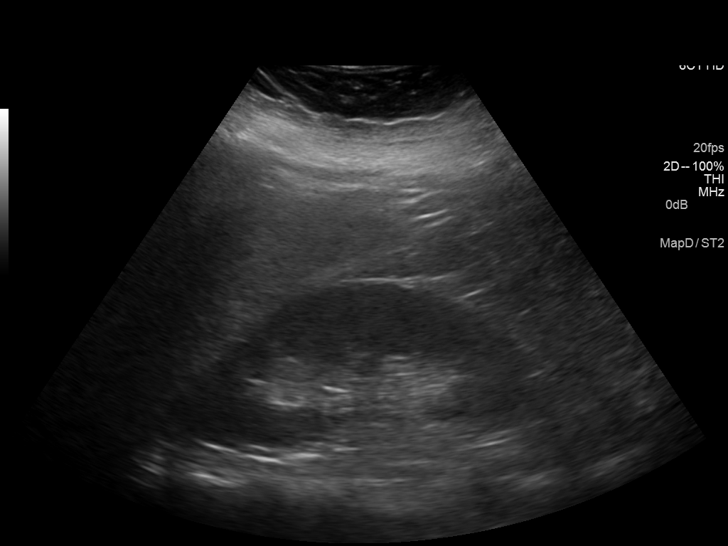
[im 3/24]
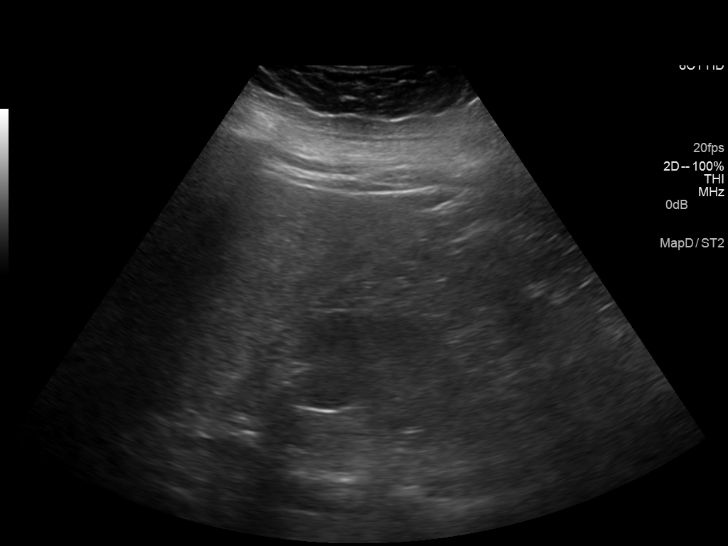
[im 5/24]
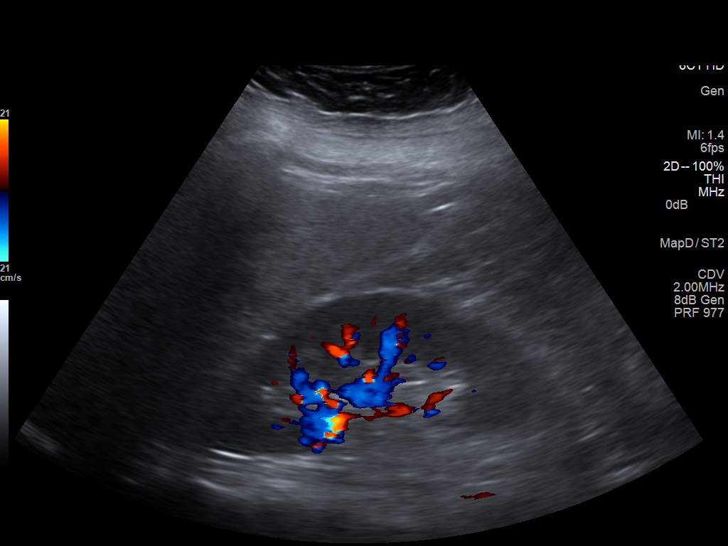
[im 7/24]
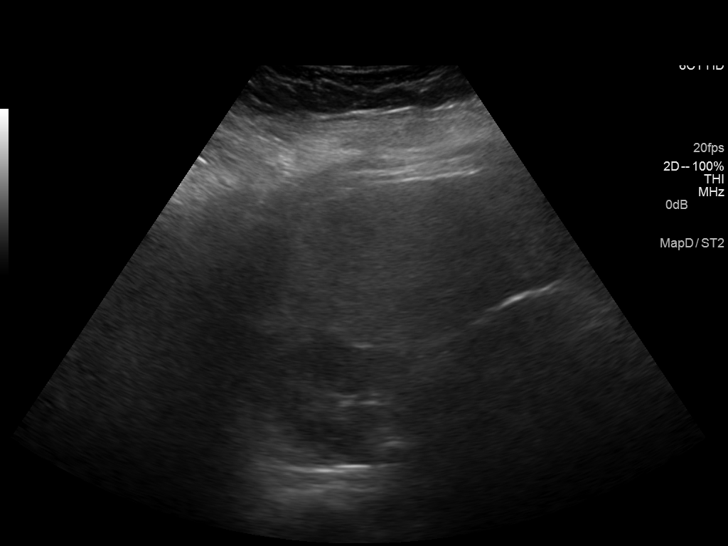
[im 9/24]
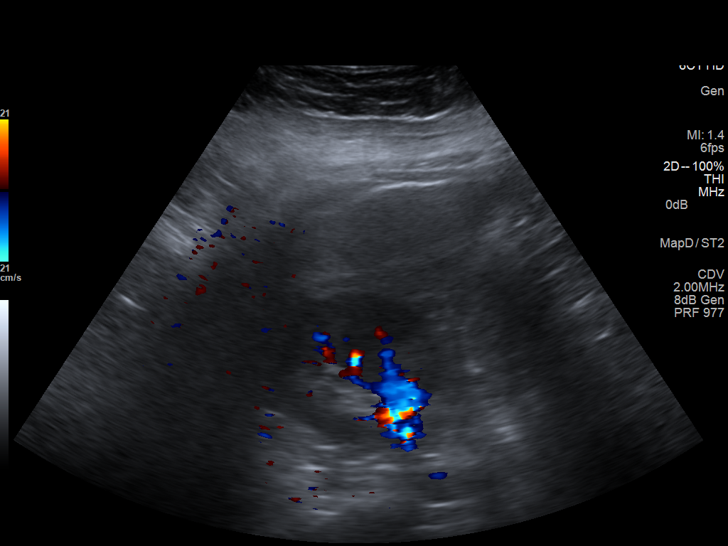
[im 10/24]
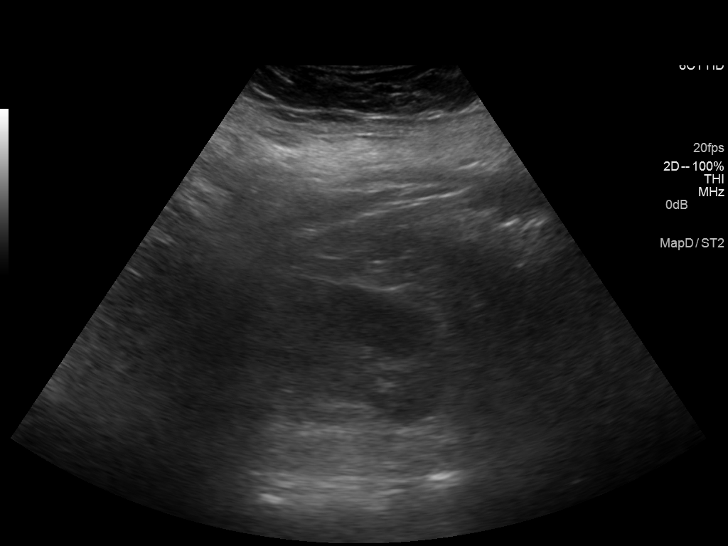
[im 12/24]
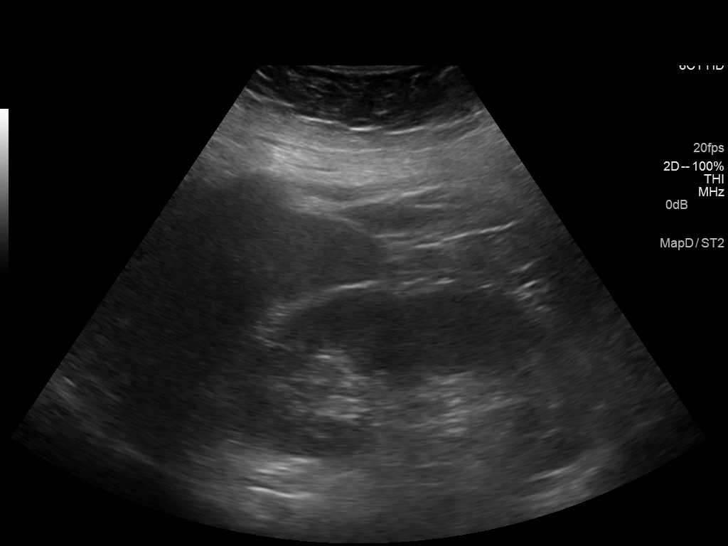
[im 14/24]
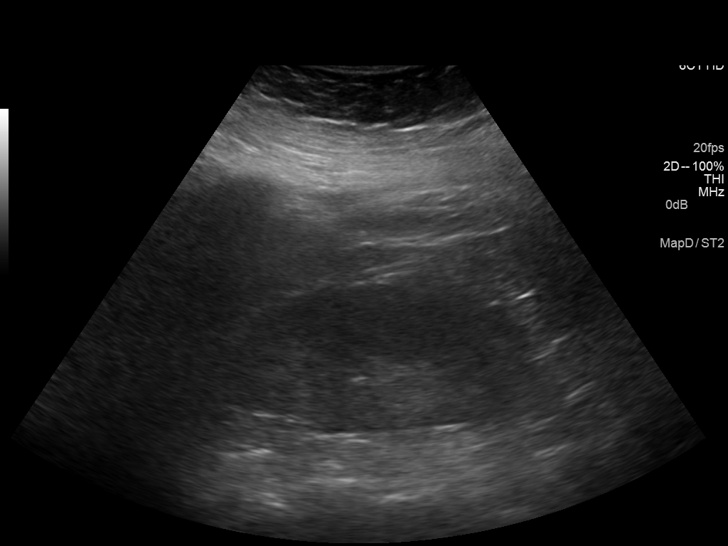
[im 16/24]
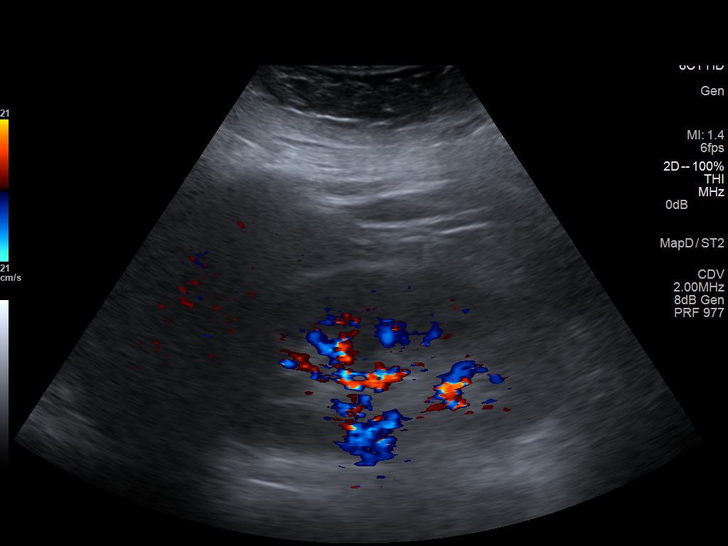
[im 17/24]
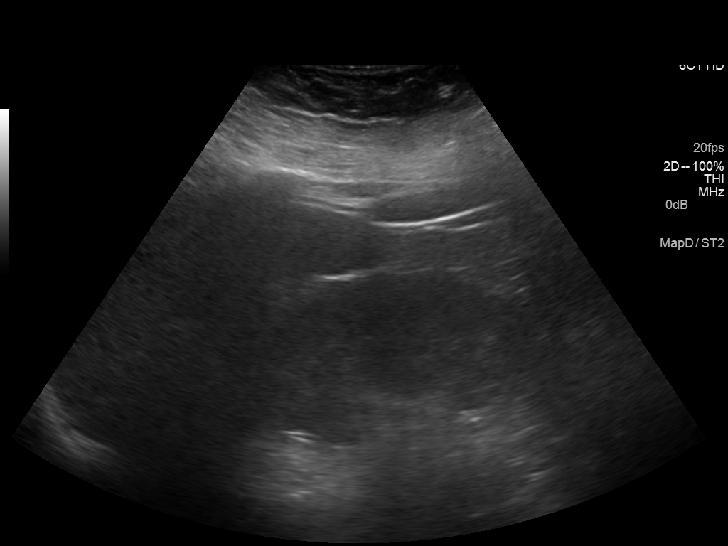
[im 19/24]
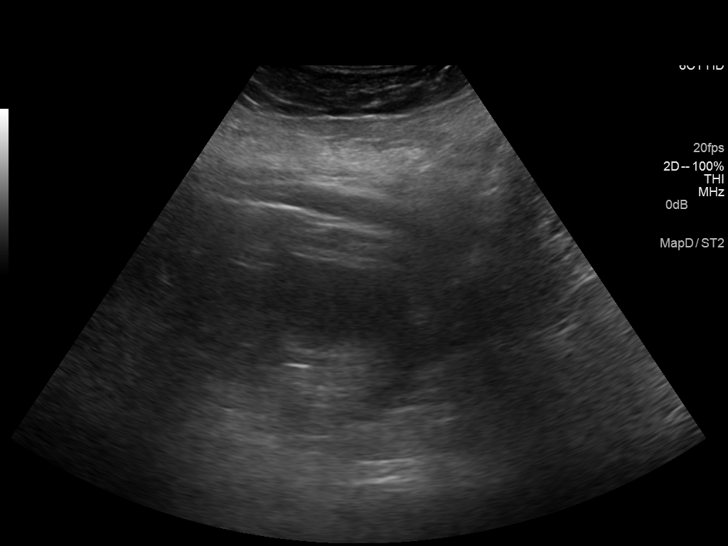
[im 21/24]
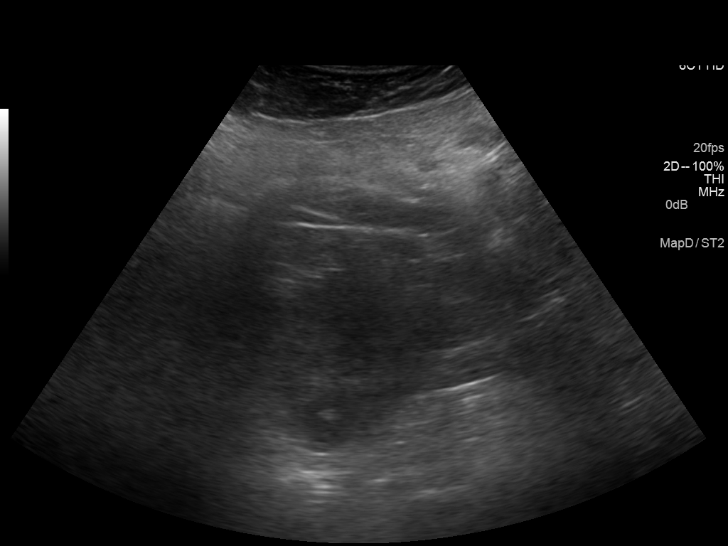
[im 23/24]
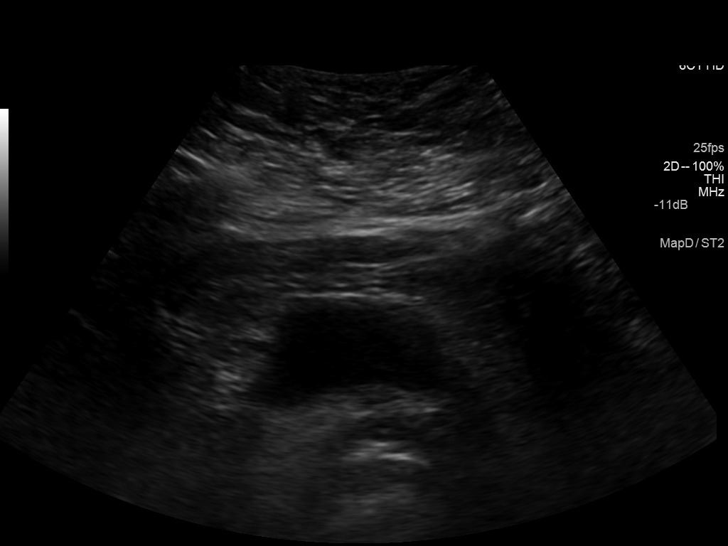

[Series 2001: us renal kidney · 0.24mm/px · 1 of 1 slices shown (2 of 2)]
[im 1/1]
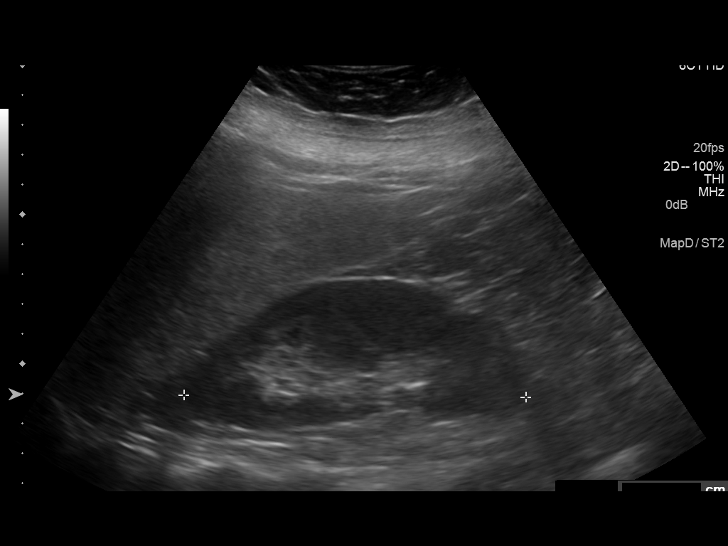

[14 of 25 positions shown; findings below may reference images not displayed]

FINDINGS: Right Kidney:

Length: 11.4 cm. Echogenicity within normal limits. No mass or
hydronephrosis visualized.

Left Kidney:

Length: 12.6 cm. Echogenicity within normal limits. No mass or
hydronephrosis visualized.

Bladder:

Appears normal for degree of bladder distention.
IMPRESSION: Normal renal ultrasound.

## 2019-09-03 ENCOUNTER — Emergency Department
Admission: EM | Admit: 2019-09-03 | Discharge: 2019-09-03 | Disposition: A | Discharge status: Home / Self Care | Payer: Medicaid Other | Attending: Emergency Medicine | Admitting: Emergency Medicine

## 2019-09-03 ENCOUNTER — Other Ambulatory Visit: Payer: Self-pay

## 2019-09-03 ENCOUNTER — Encounter: Payer: Self-pay | Admitting: Intensive Care

## 2019-09-03 ENCOUNTER — Emergency Department: Payer: Medicaid Other

## 2019-09-03 DIAGNOSIS — R6510 Systemic inflammatory response syndrome (SIRS) of non-infectious origin without acute organ dysfunction: Secondary | ICD-10-CM

## 2019-09-03 DIAGNOSIS — I10 Essential (primary) hypertension: Secondary | ICD-10-CM | POA: Insufficient documentation

## 2019-09-03 DIAGNOSIS — N12 Tubulo-interstitial nephritis, not specified as acute or chronic: Secondary | ICD-10-CM | POA: Insufficient documentation

## 2019-09-03 DIAGNOSIS — A419 Sepsis, unspecified organism: Secondary | ICD-10-CM

## 2019-09-03 DIAGNOSIS — Z20828 Contact with and (suspected) exposure to other viral communicable diseases: Secondary | ICD-10-CM | POA: Insufficient documentation

## 2019-09-03 DIAGNOSIS — J45909 Unspecified asthma, uncomplicated: Secondary | ICD-10-CM | POA: Insufficient documentation

## 2019-09-03 DIAGNOSIS — R300 Dysuria: Secondary | ICD-10-CM | POA: Diagnosis present

## 2019-09-03 HISTORY — DX: Polycystic ovarian syndrome: E28.2

## 2019-09-03 LAB — COMPREHENSIVE METABOLIC PANEL
ALT: 14 U/L (ref 0–44)
AST: 9 U/L — ABNORMAL LOW (ref 15–41)
Albumin: 3.6 g/dL (ref 3.5–5.0)
Alkaline Phosphatase: 72 U/L (ref 38–126)
Anion gap: 10 (ref 5–15)
BUN: 12 mg/dL (ref 6–20)
CO2: 23 mmol/L (ref 22–32)
Calcium: 9.7 mg/dL (ref 8.9–10.3)
Chloride: 103 mmol/L (ref 98–111)
Creatinine, Ser: 0.81 mg/dL (ref 0.44–1.00)
GFR calc Af Amer: >60 mL/min (ref >60)
GFR calc non Af Amer: >60 mL/min (ref >60)
Glucose, Bld: 124 mg/dL — ABNORMAL HIGH (ref 70–99)
Potassium: 3 mmol/L — ABNORMAL LOW (ref 3.5–5.1)
Sodium: 136 mmol/L (ref 135–145)
Total Bilirubin: 2.7 mg/dL — ABNORMAL HIGH (ref 0.3–1.2)
Total Protein: 7.8 g/dL (ref 6.5–8.1)

## 2019-09-03 LAB — CBC WITH DIFFERENTIAL/PLATELET
Abs Immature Granulocytes: 0.13 10*3/uL — ABNORMAL HIGH (ref 0.00–0.07)
Basophils Absolute: 0 10*3/uL (ref 0.0–0.1)
Basophils Relative: 0 %
Eosinophils Absolute: 0.3 10*3/uL (ref 0.0–0.5)
Eosinophils Relative: 1 %
HCT: 36.5 % (ref 36.0–46.0)
Hemoglobin: 12.5 g/dL (ref 12.0–15.0)
Immature Granulocytes: 1 %
Lymphocytes Relative: 5 %
Lymphs Abs: 1.1 10*3/uL (ref 0.7–4.0)
MCH: 27.5 pg (ref 26.0–34.0)
MCHC: 34.2 g/dL (ref 30.0–36.0)
MCV: 80.2 fL (ref 80.0–100.0)
Monocytes Absolute: 0.5 10*3/uL (ref 0.1–1.0)
Monocytes Relative: 3 %
Neutro Abs: 19.1 10*3/uL — ABNORMAL HIGH (ref 1.7–7.7)
Neutrophils Relative %: 90 %
Platelets: 347 10*3/uL (ref 150–400)
RBC: 4.55 MIL/uL (ref 3.87–5.11)
RDW: 13.3 % (ref 11.5–15.5)
WBC: 21.1 10*3/uL — ABNORMAL HIGH (ref 4.0–10.5)
nRBC: 0 % (ref 0.0–0.2)

## 2019-09-03 LAB — LACTIC ACID, PLASMA
Lactic Acid, Venous: 1.1 mmol/L (ref 0.5–1.9)
Lactic Acid, Venous: 0.9 mmol/L (ref 0.5–1.9)

## 2019-09-03 LAB — PROTIME-INR
INR: 1.3 — ABNORMAL HIGH (ref 0.8–1.2)
Prothrombin Time: 15.9 seconds — ABNORMAL HIGH (ref 11.4–15.2)

## 2019-09-03 LAB — URINALYSIS, COMPLETE (UACMP) WITH MICROSCOPIC
Bilirubin Urine: NEGATIVE
Glucose, UA: NEGATIVE mg/dL
Ketones, ur: NEGATIVE mg/dL
Nitrite: POSITIVE — AB
Protein, ur: 100 mg/dL — AB
Specific Gravity, Urine: 1.028 (ref 1.005–1.030)
WBC, UA: >50 WBC/hpf — ABNORMAL HIGH (ref 0–5)
pH: 5 (ref 5.0–8.0)

## 2019-09-03 LAB — URINALYSIS, ROUTINE W REFLEX MICROSCOPIC
Bilirubin Urine: NEGATIVE
Glucose, UA: NEGATIVE mg/dL
Ketones, ur: NEGATIVE mg/dL
Nitrite: POSITIVE — AB
Protein, ur: 100 mg/dL — AB
Specific Gravity, Urine: 1.028 (ref 1.005–1.030)
WBC, UA: >50 WBC/hpf — ABNORMAL HIGH (ref 0–5)
pH: 5 (ref 5.0–8.0)

## 2019-09-03 LAB — MAGNESIUM: Magnesium: 1.9 mg/dL (ref 1.7–2.4)

## 2019-09-03 LAB — POC SARS CORONAVIRUS 2 AG: SARS Coronavirus 2 Ag: NEGATIVE

## 2019-09-03 MED ORDER — LEVOFLOXACIN IN D5W 750 MG/150ML IV SOLN
750.0000 mg | Freq: Once | INTRAVENOUS | Status: AC
  Administered 2019-09-03: 14:00:00 750 mg via INTRAVENOUS
  Filled 2019-09-03: qty 150

## 2019-09-03 MED ORDER — SODIUM CHLORIDE 0.9 % IV BOLUS
1000.0000 mL | Freq: Once | INTRAVENOUS | Status: AC
  Administered 2019-09-03: 1000 mL via INTRAVENOUS

## 2019-09-03 MED ORDER — SODIUM CHLORIDE 0.9 % IV SOLN
1.0000 g | Freq: Once | INTRAVENOUS | Status: DC
  Filled 2019-09-03: qty 1

## 2019-09-03 MED ORDER — SODIUM CHLORIDE 0.9 % IV BOLUS
1000.0000 mL | Freq: Once | INTRAVENOUS | Status: AC
  Administered 2019-09-03: 15:00:00 1000 mL via INTRAVENOUS

## 2019-09-03 MED ORDER — POTASSIUM CHLORIDE CRYS ER 20 MEQ PO TBCR
40.0000 meq | EXTENDED_RELEASE_TABLET | Freq: Once | ORAL | Status: AC
  Administered 2019-09-03: 16:00:00 40 meq via ORAL
  Filled 2019-09-03: qty 2

## 2019-09-03 MED ORDER — LEVOFLOXACIN 750 MG PO TABS: 750.0000 mg | ORAL_TABLET | Freq: Every day | ORAL | 0 refills | Status: AC

## 2019-09-03 MED ORDER — ACETAMINOPHEN 500 MG PO TABS
1000.0000 mg | ORAL_TABLET | Freq: Once | ORAL | Status: AC
  Administered 2019-09-03: 12:00:00 1000 mg via ORAL
  Filled 2019-09-03: qty 2

## 2019-09-03 NOTE — ED Provider Notes (Signed)
[facility] Emergency Department Provider Note  ____________________________________________   First MD Initiated Contact with Patient 09/03/19 1132     (approximate)  I have reviewed the triage vital signs and the nursing notes.   HISTORY  Chief Complaint Pelvic Pain and Urinary Tract Infection    HPI Alicia Blevins is a 33 y.o. female with polycystic ovaries, hypertension who comes in with dysuria.  Patient states that a few days ago she had some left lower quadrant pain that then resolved. She has history of pain with her polycystic ovaries so this felt similar. However then she started developing some pain with urination that was severe, constant, nothing made it better, nothing made it worse.  She denies any abdominal pain at this time.  She denies any vaginal discharge.  She sexually active with one partner barrier.  Denies any concerns for STDs.  No known coronavirus contacts.          Past Medical History:  Diagnosis Date  . Asthma   . Hypertension   . Polycystic disease, ovaries     There are no active problems to display for this patient.   Past Surgical History:  Procedure Laterality Date  . TUBAL LIGATION      Prior to Admission medications   Medication Sig Start Date End Date Taking? Authorizing Provider  Calcipotriene 0.005 % solution Apply 1 Dose topically 2 (two) times daily. Brush hair, apply solution to lesions and rub in. Avoid uninvolved skin. Do not use for longer than 8 weeks. 04/05/15   Triplett, Johnette Abraham B, FNP  HYDROcodone-acetaminophen (NORCO/VICODIN) 5-325 MG per tablet Take 1 tablet by mouth every 4 (four) hours as needed for moderate pain. 04/05/15   Triplett, Cari B, FNP  sulfamethoxazole-trimethoprim (BACTRIM DS,SEPTRA DS) 800-160 MG per tablet Take 1 tablet by mouth 2 (two) times daily. 04/05/15   Triplett, Johnette Abraham B, FNP    Allergies Amoxicillin, Penicillins, Red dye, and Tramadol  History reviewed. No pertinent  family history.  Social History Social History   Tobacco Use  . Smoking status: Never Smoker  . Smokeless tobacco: Never Used  Substance Use Topics  . Alcohol use: Yes  . Drug use: No      Review of Systems Constitutional: No fever/chills Eyes: No visual changes. ENT: No sore throat. Cardiovascular: Denies chest pain. Respiratory: Denies shortness of breath. Gastrointestinal: No abdominal pain.  No nausea, no vomiting.  No diarrhea.  No constipation. Genitourinary: Positive dysuria Musculoskeletal: Negative for back pain. Skin: Negative for rash. Neurological: Negative for headaches, focal weakness or numbness. All other ROS negative ____________________________________________   PHYSICAL EXAM:  VITAL SIGNS: ED Triage Vitals  Enc Vitals Group     BP 09/03/19 1119 (!) 148/94     Pulse Rate 09/03/19 1119 (!) 143     Resp 09/03/19 1119 18     Temp 09/03/19 1119 (!) 102.4 F (39.1 C)     Temp Source 09/03/19 1119 Oral     SpO2 09/03/19 1119 97 %     Weight 09/03/19 1120 264 lb (119.7 kg)     Height 09/03/19 1120 4' 11.5" (1.511 m)     Head Circumference --      Peak Flow --      Pain Score 09/03/19 1119 8     Pain Loc --      Pain Edu? --      Excl. in [place]? --     Constitutional: Alert and oriented. Well appearing and in  no acute distress. Eyes: Conjunctivae are normal. EOMI. Head: Atraumatic. Nose: No congestion/rhinnorhea. Mouth/Throat: Mucous membranes are moist.   Neck: No stridor. Trachea Midline. FROM Cardiovascular: Tachycardic, regular rhythm. Grossly normal heart sounds.  Good peripheral circulation. Respiratory: Normal respiratory effort.  No retractions. Lungs CTAB. Gastrointestinal: Soft and nontender. No distention. No abdominal bruits.  Musculoskeletal: No lower extremity tenderness nor edema.  No joint effusions. Neurologic:  Normal speech and language. No gross focal neurologic deficits are appreciated.  Skin:  Skin is warm, dry and intact. No  rash noted. Psychiatric: Mood and affect are normal. Speech and behavior are normal. GU: Deferred  Back: No CVA tenderness ____________________________________________   LABS (all labs ordered are listed, but only abnormal results are displayed)  Labs Reviewed  COMPREHENSIVE METABOLIC PANEL - Abnormal; Notable for the following components:      Result Value   Potassium 3.0 (*)    Glucose, Bld 124 (*)    AST 9 (*)    Total Bilirubin 2.7 (*)    All other components within normal limits  CBC WITH DIFFERENTIAL/PLATELET - Abnormal; Notable for the following components:   WBC 21.1 (*)    Neutro Abs 19.1 (*)    Abs Immature Granulocytes 0.13 (*)    All other components within normal limits  PROTIME-INR - Abnormal; Notable for the following components:   Prothrombin Time 15.9 (*)    INR 1.3 (*)    All other components within normal limits  URINALYSIS, COMPLETE (UACMP) WITH MICROSCOPIC - Abnormal; Notable for the following components:   Color, Urine AMBER (*)    APPearance CLOUDY (*)    Hgb urine dipstick MODERATE (*)    Protein, ur 100 (*)    Nitrite POSITIVE (*)    Leukocytes,Ua MODERATE (*)    WBC, UA >50 (*)    Bacteria, UA MANY (*)    All other components within normal limits  URINALYSIS, ROUTINE W REFLEX MICROSCOPIC - Abnormal; Notable for the following components:   Color, Urine AMBER (*)    APPearance TURBID (*)    Hgb urine dipstick MODERATE (*)    Protein, ur 100 (*)    Nitrite POSITIVE (*)    Leukocytes,Ua LARGE (*)    WBC, UA >50 (*)    Bacteria, UA RARE (*)    All other components within normal limits  CULTURE, BLOOD (ROUTINE X 2)  CULTURE, BLOOD (ROUTINE X 2)  URINE CULTURE  URINE CULTURE  LACTIC ACID, PLASMA  LACTIC ACID, PLASMA  MAGNESIUM  URINALYSIS, ROUTINE W REFLEX MICROSCOPIC  POC SARS CORONAVIRUS 2 AG -  ED  POC SARS CORONAVIRUS 2 AG   ____________________________________________   ED ECG REPORT I, Concha SeMary E Lilliona Blakeney, the attending physician,  personally viewed and interpreted this ECG.  EKG sinus tachycardia rate of 139, no ST elevation, no T wave inversion, normal intervals ____________________________________________  RADIOLOGY Vela ProseI, Othello Sgroi E Jaykub Mackins, personally viewed and evaluated these images (plain radiographs) as part of my medical decision making, as well as reviewing the written report by the radiologist.  ED MD interpretation: No evidence of pneumonia  Official radiology report(s): Dg Chest Port 1 View  Result Date: 09/03/2019 CLINICAL DATA:  33 year old female with history of right lower quadrant abdominal pain. No cough. No fever. Possible sepsis. EXAM: PORTABLE CHEST 1 VIEW COMPARISON:  Chest x-ray 05/12/2005. FINDINGS: Lung volumes are normal. No consolidative airspace disease. No pleural effusions. No pneumothorax. No pulmonary nodule or mass noted. Pulmonary vasculature and the cardiomediastinal silhouette are within normal  limits. IMPRESSION: No radiographic evidence of acute cardiopulmonary disease. Electronically Signed   By: Trudie Reed M.D.   On: 09/03/2019 12:43    ____________________________________________   PROCEDURES  Procedure(s) performed (including Critical Care):  .Critical Care Performed by: Concha Se, MD Authorized by: Concha Se, MD   Critical care provider statement:    Critical care time (minutes):  31   Critical care was necessary to treat or prevent imminent or life-threatening deterioration of the following conditions:  Sepsis   Critical care was time spent personally by me on the following activities:  Discussions with consultants, evaluation of patient's response to treatment, examination of patient, ordering and performing treatments and interventions, ordering and review of laboratory studies, ordering and review of radiographic studies, pulse oximetry, re-evaluation of patient's condition, obtaining history from patient or surrogate and review of old charts      ____________________________________________   INITIAL IMPRESSION / ASSESSMENT AND PLAN / ED COURSE  Alicia Blevins was evaluated in Emergency Department on 09/03/2019 for the symptoms described in the history of present illness. She was evaluated in the context of the global COVID-19 pandemic, which necessitated consideration that the patient might be at risk for infection with the SARS-CoV-2 virus that causes COVID-19. Institutional protocols and algorithms that pertain to the evaluation of patients at risk for COVID-19 are in a state of rapid change based on information released by regulatory bodies including the CDC and federal and state organizations. These policies and algorithms were followed during the patient's care in the ED.    This is most concerning for infection given patient is febrile and tachycardic with dysuria.  Will get urine to evaluate for UTI.  She denies any lower pelvic pain at this time or concerns for STDs to suggest tubo-ovarian abscess.  She also has no abdominal tenderness at this time to suggest appendicitis, diverticulitis, kidney stones although I would have a throat low threshold to CT scan her if she develops pain.  Patient would like to hold off on CT scan at this time unless urine is unrevealing for infection.  No neck stiffness or altered mental status to suggest meningitis.  Lower suspicion for bacteremia given she is well-appearing young female with no risk factors for that but will get blood culture to evaluate.    Lactate was normal which is reassuring.  Urine is consistent with UTI.  White count is elevated 21 consistent with my concern for infection.  Kidney function was normal.  K was 3.0.  Will give 40 of p.o. K  Patient significant fever and tachycardia with her urinary symptoms I suspect that this is more likely related to pyelonephritis.  Code sepsis was called the patient was given 2 L of fluids and antibiotics.  Given patient's allergies  including anaphylaxis to penicillin patient given Levaquin.  2:18 PMReevaluated patient offered patient admission for concern for pyelonephritis.  Patient is adamant she does not want me admitted to the hospital.  3:34 PM reevaluated patient.  Patient finished 2 L of fluid.  Her heart rates come down to the 110s.  Patient says she normally runs a little elevated.  Again I offered patient admission for sepsis and pyelonephritis.  Patient again declines.  Given patient's vital signs are getting better and she said no risk factor for bacteremia we will outpatient be discharged home on a course of Levaquin.  Patient understands return precautions in regards to developing abdominal pain, worsening symptoms or any other concerns.  I discussed  the provisional nature of ED diagnosis, the treatment so far, the ongoing plan of care, follow up appointments and return precautions with the patient and any family or support people present. They expressed understanding and agreed with the plan, discharged home.  ____________________________________   FINAL CLINICAL IMPRESSION(S) / ED DIAGNOSES   Final diagnoses:  SIRS (systemic inflammatory response syndrome) (HCC)  Pyelonephritis      MEDICATIONS GIVEN DURING THIS VISIT:  Medications  potassium chloride SA (KLOR-CON) CR tablet 40 mEq (has no administration in time range)  acetaminophen (TYLENOL) tablet 1,000 mg (1,000 mg Oral Given 09/03/19 1227)  sodium chloride 0.9 % bolus 1,000 mL (1,000 mLs Intravenous New Bag/Given 09/03/19 1434)  sodium chloride 0.9 % bolus 1,000 mL (0 mLs Intravenous Stopped 09/03/19 1436)  levofloxacin (LEVAQUIN) IVPB 750 mg (750 mg Intravenous New Bag/Given 09/03/19 1404)     ED Discharge Orders         Ordered    levofloxacin (LEVAQUIN) 750 MG tablet  Daily     09/03/19 1539           Note:  This document was prepared using Dragon voice recognition software and may include unintentional dictation errors.    Concha Se, MD 09/03/19 1540

## 2019-09-03 NOTE — ED Notes (Signed)
Pt hard IV stick, this nurse and triage nurse attempted and were unsuccessful. Bill, RN at bedside with ultrasound for IV. EDP notified.

## 2019-09-03 NOTE — ED Triage Notes (Signed)
Patient c/o pelvic/ovary pain X2-3 days. Believes she has a UTI. C/o burning during urination. Fever in triage and elevated HR

## 2019-09-03 NOTE — ED Notes (Signed)
Only one IV started and one set of blood cultures drawn. Dr. Jari Pigg notified and aware.

## 2019-09-03 NOTE — Discharge Instructions (Addendum)
Your work-up was concerning for severe infection from UTI such as pyelonephritis.  Your white count was significantly elevated and your heart rate was elevated.  We discussed admission for rule out of your blood cultures versus going home and you prefer to go home.  Will prescribe a course of antibiotics.  Please return to the ER if you develop worsening symptoms or any other concerns.  You should follow with your primary care doctor in 2 days.  You should also follow-up in MyChart to make sure culture was sensitive to the antibiotic we chose.

## 2019-09-03 NOTE — Progress Notes (Signed)
CODE SEPSIS - PHARMACY COMMUNICATION  **Broad Spectrum Antibiotics should be administered within 1 hour of Sepsis diagnosis**  Time Code Sepsis Called/Page Received: 11/28 @ 1312  Antibiotics Ordered: Levofloxacin  Time of 1st antibiotic administration: 11/28 @1404   Additional action taken by pharmacy: S/w patient's nurse to follow up with delivery of antibiotics within 1 hour of code.  If necessary, Name of Provider/Nurse Contacted: [place] ,PharmD 09/03/2019  2:06 PM

## 2019-09-05 LAB — URINE CULTURE: Culture: >=100000 — AB

## 2019-09-06 NOTE — Progress Notes (Signed)
Brief Pharmacy Note  Patient is a 34 y/o F with medical history of polycystic ovaries, hypertension who presented to [facility] ED 11/28 c/o pelvic pain and dysuria. WBC 21. Patient was found to be tachycardic and febrile and Code Sepsis was called. Presentation was c/f pyelonephritis. Patient did not want to be admitted and was discharged on levofloxacin. Urine culture from ED visit has resulted >100k colonies/mL E coli (pan-sensitive). Bcx (one set) with no growth x 3 days. Patient is appropriately covered on discharge antibiotic. No further action indicated.  [place] Resident 06 September 2019

## 2019-09-08 LAB — CULTURE, BLOOD (ROUTINE X 2)
Culture: NO GROWTH
Special Requests: ADEQUATE

## 2019-09-14 ENCOUNTER — Ambulatory Visit: Payer: Self-pay

## 2019-09-19 ENCOUNTER — Other Ambulatory Visit: Payer: Self-pay

## 2019-09-19 ENCOUNTER — Ambulatory Visit: Payer: Medicaid Other | Admitting: Family Medicine

## 2019-09-19 DIAGNOSIS — A599 Trichomoniasis, unspecified: Secondary | ICD-10-CM | POA: Diagnosis not present

## 2019-09-19 DIAGNOSIS — Z113 Encounter for screening for infections with a predominantly sexual mode of transmission: Secondary | ICD-10-CM

## 2019-09-19 DIAGNOSIS — Z202 Contact with and (suspected) exposure to infections with a predominantly sexual mode of transmission: Secondary | ICD-10-CM | POA: Diagnosis not present

## 2019-09-19 LAB — WET PREP FOR TRICH, YEAST, CLUE
Trichomonas Exam: POSITIVE — AB
Yeast Exam: NEGATIVE

## 2019-09-19 MED ORDER — METRONIDAZOLE 500 MG PO TABS: 500.0000 mg | ORAL_TABLET | Freq: Two times a day (BID) | ORAL | 0 refills | Status: AC

## 2019-09-19 MED ORDER — AZITHROMYCIN 1 G PO PACK: 2.0000 g | PACK | Freq: Once | ORAL | 0 refills | Status: AC

## 2019-09-19 MED ORDER — GEMIFLOXACIN MESYLATE 320 MG PO TABS: 320.0000 mg | ORAL_TABLET | Freq: Every day | ORAL | 0 refills | Status: DC

## 2019-09-19 NOTE — Progress Notes (Signed)
[facility] Department STI clinic/screening visit  Subjective:  Alicia Blevins is a 33 y.o. female being seen today for  Chief Complaint  Patient presents with  . SEXUALLY TRANSMITTED DISEASE  .  Patient has the following medical conditions:  There are no problems to display for this patient.    Pt states she is a contact to gonorrhea, needs treatment. Denies any symptoms, including abd pain, fever, n/v, discharge.   See flowsheet for further details and programmatic requirements.    No components found for: HCV]  The following portions of the patient's history were reviewed and updated as appropriate: allergies, current medications, past medical history, past social history, past surgical history and problem list.  Objective:  There were no vitals filed for this visit.   Physical Exam Vitals and nursing note reviewed.  Constitutional:      Appearance: Normal appearance.  HENT:     Head: Normocephalic and atraumatic.     Mouth/Throat:     Mouth: Mucous membranes are moist.     Pharynx: Oropharynx is clear. No oropharyngeal exudate or posterior oropharyngeal erythema.  Pulmonary:     Effort: Pulmonary effort is normal.  Abdominal:     General: Abdomen is flat.     Palpations: There is no mass.     Tenderness: There is no abdominal tenderness. There is no rebound.  Genitourinary:    General: Normal vulva.     Exam position: Lithotomy position.     Pubic Area: No rash or pubic lice.      Labia:        Right: No rash or lesion.        Left: No rash or lesion.      Vagina: Normal. No vaginal discharge, erythema, bleeding or lesions.     Cervix: Discharge (white, creamy, ph>4.5) present. No cervical motion tenderness, friability, lesion or erythema.     Uterus: Normal.      Adnexa: Right adnexa normal and left adnexa normal.     Rectum: Normal.  Lymphadenopathy:     Head:     Right side of head: No preauricular or posterior auricular adenopathy.    Left side of head: No preauricular or posterior auricular adenopathy.     Cervical: No cervical adenopathy.     Upper Body:     Right upper body: No supraclavicular or axillary adenopathy.     Left upper body: No supraclavicular or axillary adenopathy.     Lower Body: No right inguinal adenopathy. No left inguinal adenopathy.  Skin:    General: Skin is warm and dry.     Findings: No rash.  Neurological:     Mental Status: She is alert and oriented to person, place, and time.       Assessment and Plan:  Alicia Blevins is a 33 y.o. female presenting to the [facility] Department for STI screening    1. Gonorrhea contact Will tx as contact to gonorrhea as below (gemifloxacin d/t pcn allergy, azith powder d/t red dye allergy) -Pt to let us know if vomits < 2 hr after taking medicine. -Advised no sex for 7 days after both pt and partner completes treatment and encouraged condoms with all sex. - gemifloxacin (FACTIVE) 320 MG tablet; Take 1 tablet (320 mg total) by mouth daily.  Dispense: 1 tablet; Refill: 0 - azithromycin (ZITHROMAX) 1 g powder; Take 2 packets (2 g total) by mouth once for 1 dose.  Dispense: 1 each; Refill:  0  2. Trichomoniasis Wet prep shows trich, tx as below: - metroNIDAZOLE (FLAGYL) 500 MG tablet; Take 1 tablet (500 mg total) by mouth 2 (two) times daily for 7 days.  Dispense: 14 tablet; Refill: 0  3. Screening examination for venereal disease -Screenings today as below. Treat wet prep per standing order -Patient does meet criteria for HepB, HepC Screening. Accepts these screenings. -Counseled on warning s/sx and when to seek care. Recommended condom use with all sex. - WET PREP FOR [place], YEAST, CLUE - Chlamydia/Gonorrhea [place] [facility] - HIV/HCV [place] [facility] - Syphilis Serology, [place] [facility] - HBV Antigen/Antibody [facility]   Return for screening as needed.  No future appointments.  Kandee Keen, PA-C

## 2019-09-20 ENCOUNTER — Encounter: Payer: Self-pay | Admitting: Family Medicine

## 2019-09-20 NOTE — Progress Notes (Signed)
Wet mount reviewed by provider, pt treated for BV and Trich per provider orders. Provider orders completed. 

## 2019-09-21 ENCOUNTER — Telehealth: Payer: Self-pay | Admitting: Family Medicine

## 2019-09-21 NOTE — Telephone Encounter (Signed)
I spoke with Alicia Blevins by phone to verify that she had received the treatment prescribed to [facility] of gemifloxacin tablet and azithromycin powder. She said she had not. Our clinic now has gentamicin injections available so I advised pt to come to clinic today to get this and to then pick up and take azithromycin powder from [facility] today as well.

## 2019-09-26 LAB — HEPATITIS B SURFACE ANTIGEN

## 2019-09-28 LAB — HM HIV SCREENING LAB: HM HIV Screening: NEGATIVE

## 2019-09-28 LAB — HM HEPATITIS C SCREENING LAB: HM Hepatitis Screen: NEGATIVE

## 2020-10-02 ENCOUNTER — Ambulatory Visit
Admission: EM | Admit: 2020-10-02 | Discharge: 2020-10-02 | Disposition: A | Discharge status: Home / Self Care | Payer: Medicaid Other | Attending: Family Medicine | Admitting: Family Medicine

## 2020-10-02 ENCOUNTER — Encounter: Payer: Self-pay | Admitting: Family Medicine

## 2020-10-02 DIAGNOSIS — H6690 Otitis media, unspecified, unspecified ear: Secondary | ICD-10-CM

## 2020-10-02 DIAGNOSIS — B349 Viral infection, unspecified: Secondary | ICD-10-CM

## 2020-10-02 MED ORDER — CLINDAMYCIN HCL 150 MG PO CAPS: 300.0000 mg | ORAL_CAPSULE | Freq: Three times a day (TID) | ORAL | 0 refills | Status: AC

## 2020-10-02 MED ORDER — BENZONATATE 100 MG PO CAPS: 200.0000 mg | ORAL_CAPSULE | Freq: Three times a day (TID) | ORAL | 0 refills | Status: DC

## 2020-10-02 NOTE — Discharge Instructions (Addendum)
Treating you for and ear infection.  Take the medicine as prescribed  Cough medicine as needed.  Follow up as needed for continued or worsening symptoms

## 2020-10-03 NOTE — ED Provider Notes (Signed)
Renaldo Fiddler    CSN: [id number] Arrival date & time: 10/02/20  1125      History   Chief Complaint No chief complaint on file.   HPI Alicia Blevins is a 34 y.o. female.   Patient is a 34 year old female who presents today with flulike symptoms.  This is been constant for the past couple days.  Cough, chest congestion, nasal congestion, fever, body aches.  Also having right ear pain.  History of ear infections.  Possible sick exposure.      Past Medical History:  Diagnosis Date   Asthma    Hypertension    Polycystic disease, ovaries     There are no problems to display for this patient.   Past Surgical History:  Procedure Laterality Date   TUBAL LIGATION      OB History   No obstetric history on file.      Home Medications    Prior to Admission medications   Medication Sig Start Date End Date Taking? Authorizing Provider  benzonatate (TESSALON) 100 MG capsule Take 2 capsules (200 mg total) by mouth every 8 (eight) hours. 10/02/20  Yes Ernie Sagrero A, NP  clindamycin (CLEOCIN) 150 MG capsule Take 2 capsules (300 mg total) by mouth 3 (three) times daily for 7 days. 10/02/20 10/09/20 Yes Cleo Villamizar A, NP  gemifloxacin (FACTIVE) 320 MG tablet Take 1 tablet (320 mg total) by mouth daily. 09/19/19   Staples, Hulda Humphrey, PA-C    Family History History reviewed. No pertinent family history.  Social History Social History   Tobacco Use   Smoking status: Never Smoker   Smokeless tobacco: Never Used  Substance Use Topics   Alcohol use: Yes   Drug use: No     Allergies   Penicillins, Red dye, Amoxicillin, and Tramadol   Review of Systems Review of Systems   Physical Exam Triage Vital Signs ED Triage Vitals  Enc Vitals Group     BP      Pulse      Resp      Temp      Temp src      SpO2      Weight      Height      Head Circumference      Peak Flow      Pain Score      Pain Loc      Pain Edu?      Excl. in GC?    No data  found.  Updated Vital Signs There were no vitals taken for this visit.  Visual Acuity Right Eye Distance:   Left Eye Distance:   Bilateral Distance:    Right Eye Near:   Left Eye Near:    Bilateral Near:     Physical Exam Vitals and nursing note reviewed.  Constitutional:      General: She is not in acute distress.    Appearance: Normal appearance. She is not ill-appearing, toxic-appearing or diaphoretic.  HENT:     Head: Normocephalic.     Left Ear: Tympanic membrane normal.     Ears:     Comments: Right TM erythematous and bulging    Nose: Nose normal.     Mouth/Throat:     Pharynx: Oropharynx is clear.  Eyes:     Conjunctiva/sclera: Conjunctivae normal.  Cardiovascular:     Rate and Rhythm: Normal rate and regular rhythm.  Pulmonary:     Effort: Pulmonary effort is normal.  Breath sounds: Normal breath sounds.  Musculoskeletal:        General: Normal range of motion.     Cervical back: Normal range of motion.  Skin:    General: Skin is warm and dry.     Findings: No rash.  Neurological:     Mental Status: She is alert.  Psychiatric:        Mood and Affect: Mood normal.      UC Treatments / Results  Labs (all labs ordered are listed, but only abnormal results are displayed) Labs Reviewed  COVID-19, FLU A+B NAA    EKG   Radiology No results found.  Procedures Procedures (including critical care time)  Medications Ordered in UC Medications - No data to display  Initial Impression / Assessment and Plan / UC Course  I have reviewed the triage vital signs and the nursing notes.  Pertinent labs & imaging results that were available during my care of the patient were reviewed by me and considered in my medical decision making (see chart for details).     Acute otitis media.  Treating with clindamycin based on amoxicillin and penicillin allergy. Covid and flu swab pending for viral symptoms Tessalon Perles for cough as needed Rest, hydrate.   Over-the-counter medicines as needed. Follow up as needed for continued or worsening symptoms  Final Clinical Impressions(s) / UC Diagnoses   Final diagnoses:  Acute otitis media, unspecified otitis media type  Viral illness     Discharge Instructions     Treating you for and ear infection.  Take the medicine as prescribed  Cough medicine as needed.  Follow up as needed for continued or worsening symptoms     ED Prescriptions    Medication Sig Dispense Auth. Provider   benzonatate (TESSALON) 100 MG capsule Take 2 capsules (200 mg total) by mouth every 8 (eight) hours. 30 capsule Lillian Ballester A, NP   clindamycin (CLEOCIN) 150 MG capsule Take 2 capsules (300 mg total) by mouth 3 (three) times daily for 7 days. 42 capsule Beau Ramsburg A, NP     PDMP not reviewed this encounter.   Dahlia Byes A, NP 10/03/20 1045

## 2020-10-05 LAB — COVID-19, FLU A+B NAA
Influenza A, NAA: NOT DETECTED
Influenza B, NAA: NOT DETECTED
SARS-CoV-2, NAA: NOT DETECTED

## 2021-08-05 ENCOUNTER — Other Ambulatory Visit: Payer: Self-pay

## 2021-08-05 ENCOUNTER — Ambulatory Visit
Admission: RE | Admit: 2021-08-05 | Discharge: 2021-08-05 | Disposition: A | Discharge status: Home / Self Care | Payer: Medicaid Other | Source: Ambulatory Visit | Attending: Emergency Medicine | Admitting: Emergency Medicine

## 2021-08-05 VITALS — BP 160/88 | HR 97 | Temp 98.7°F | Resp 18 | Ht 59.5 in | Wt 249.7 lb

## 2021-08-05 DIAGNOSIS — J069 Acute upper respiratory infection, unspecified: Secondary | ICD-10-CM | POA: Diagnosis not present

## 2021-08-05 DIAGNOSIS — H66003 Acute suppurative otitis media without spontaneous rupture of ear drum, bilateral: Secondary | ICD-10-CM

## 2021-08-05 MED ORDER — IPRATROPIUM BROMIDE 0.06 % NA SOLN: 2.0000 | Freq: Four times a day (QID) | NASAL | 12 refills | Status: DC

## 2021-08-05 MED ORDER — CLINDAMYCIN HCL 150 MG PO CAPS: 300.0000 mg | ORAL_CAPSULE | Freq: Three times a day (TID) | ORAL | 0 refills | Status: AC

## 2021-08-05 NOTE — ED Provider Notes (Signed)
[facility]    CSN: [id number] Arrival date & time: 08/05/21  1740      History   Chief Complaint Chief Complaint  Patient presents with   Otalgia    HPI Alicia Blevins is a 35 y.o. female.   HPI  35 year old female here for evaluation of respiratory complaints.  Patient reports that for the last 2 days she has been experiencing nasal congestion and pain in both of her ears.  Today she developed a slight runny nose for clear nasal discharge, sore throat, and a nonproductive cough.  She has taken a home COVID test that was negative.  She denies fever, nasal discharge, shortness breath or wheezing, or GI complaints.  Past Medical History:  Diagnosis Date   Asthma    Hypertension    Polycystic disease, ovaries     There are no problems to display for this patient.   Past Surgical History:  Procedure Laterality Date   TUBAL LIGATION      OB History   No obstetric history on file.      Home Medications    Prior to Admission medications   Medication Sig Start Date End Date Taking? Authorizing Provider  clindamycin (CLEOCIN) 150 MG capsule Take 2 capsules (300 mg total) by mouth 3 (three) times daily for 7 days. 08/05/21 08/12/21 Yes Becky Augusta, NP  ipratropium (ATROVENT) 0.06 % nasal spray Place 2 sprays into both nostrils 4 (four) times daily. 08/05/21  Yes Becky Augusta, NP    Family History History reviewed. No pertinent family history.  Social History Social History   Tobacco Use   Smoking status: Never   Smokeless tobacco: Never  Substance Use Topics   Alcohol use: Yes   Drug use: No     Allergies   Penicillins, Red dye, Amoxicillin, and Tramadol   Review of Systems Review of Systems  Constitutional:  Negative for activity change, appetite change and fever.  HENT:  Positive for congestion, ear pain and sore throat. Negative for ear discharge and rhinorrhea.   Respiratory:  Positive for cough. Negative for shortness of breath.    Gastrointestinal:  Negative for diarrhea, nausea and vomiting.    Physical Exam Triage Vital Signs ED Triage Vitals  Enc Vitals Group     BP 08/05/21 1828 (!) 160/88     Pulse Rate 08/05/21 1828 97     Resp 08/05/21 1828 18     Temp 08/05/21 1828 98.7 F (37.1 C)     Temp Source 08/05/21 1828 Oral     SpO2 08/05/21 1828 100 %     Weight 08/05/21 1826 249 lb 11.2 oz (113.3 kg)     Height 08/05/21 1826 4' 11.5" (1.511 m)     Head Circumference --      Peak Flow --      Pain Score 08/05/21 1826 8     Pain Loc --      Pain Edu? --      Excl. in GC? --    No data found.  Updated Vital Signs BP (!) 160/88 (BP Location: Left Arm)   Pulse 97   Temp 98.7 F (37.1 C) (Oral)   Resp 18   Ht 4' 11.5" (1.511 m)   Wt 249 lb 11.2 oz (113.3 kg)   LMP 07/08/2021   SpO2 100%   BMI 49.59 kg/m   Visual Acuity Right Eye Distance:   Left Eye Distance:   Bilateral Distance:    Right Eye Near:  Left Eye Near:    Bilateral Near:     Physical Exam Vitals and nursing note reviewed.  Constitutional:      General: She is not in acute distress.    Appearance: Normal appearance. She is not ill-appearing.  HENT:     Head: Normocephalic and atraumatic.     Right Ear: Ear canal and external ear normal.     Left Ear: Ear canal and external ear normal.     Nose: Congestion and rhinorrhea present.     Mouth/Throat:     Mouth: Mucous membranes are moist.     Pharynx: Oropharynx is clear. Posterior oropharyngeal erythema present.  Cardiovascular:     Rate and Rhythm: Normal rate and regular rhythm.     Pulses: Normal pulses.     Heart sounds: Normal heart sounds. No murmur heard.   No gallop.  Pulmonary:     Effort: Pulmonary effort is normal.     Breath sounds: Normal breath sounds. No wheezing, rhonchi or rales.  Musculoskeletal:     Cervical back: Normal range of motion and neck supple.  Lymphadenopathy:     Cervical: No cervical adenopathy.  Skin:    General: Skin is warm and  dry.     Capillary Refill: Capillary refill takes less than 2 seconds.     Findings: No erythema or rash.  Neurological:     General: No focal deficit present.     Mental Status: She is alert and oriented to person, place, and time.  Psychiatric:        Mood and Affect: Mood normal.        Behavior: Behavior normal.        Thought Content: Thought content normal.        Judgment: Judgment normal.     UC Treatments / Results  Labs (all labs ordered are listed, but only abnormal results are displayed) Labs Reviewed - No data to display  EKG   Radiology No results found.  Procedures Procedures (including critical care time)  Medications Ordered in UC Medications - No data to display  Initial Impression / Assessment and Plan / UC Course  I have reviewed the triage vital signs and the nursing notes.  Pertinent labs & imaging results that were available during my care of the patient were reviewed by me and considered in my medical decision making (see chart for details).  Patient is a nontoxic-appearing 35 year old female here for evaluation of respiratory complaints as outlined in HPI above.  Patient's physical exam reveals bilateral erythematous and injected tympanic membranes with a loss of landmarks.  The left external auditory canal is clear.  The right external auditory canal has some moist cerumen in the floor.  Nasal mucosa is erythematous and edematous with scant clear nasal discharge.  Oropharyngeal exam reveals mild posterior oropharyngeal erythema with clear postnasal drip.  No cervical lymphadenopathy appreciated on exam.  Cardiopulmonary exam reveals clear lung sounds in all fields.  Patient exam is consistent with a URI and bilateral otitis media.  We will treat with clindamycin 300 mg 3 times daily x7 days as patient has an allergy to amoxicillin and penicillins.  She is unsure if she is ever had a cephalosporin before.  We will also give Atrovent nasal spray to help  with the nasal congestion.  Work note provided.   Final Clinical Impressions(s) / UC Diagnoses   Final diagnoses:  Non-recurrent acute suppurative otitis media of both ears without spontaneous rupture of tympanic membranes  Upper respiratory tract infection, unspecified type     Discharge Instructions      Take the Clindamycin 3 times daily for 7 days with food for treatment of your ear infection.  Take an over-the-counter probiotic 1 hour after each dose of antibiotic to prevent diarrhea.  Use over-the-counter Tylenol and ibuprofen as needed for pain or fever.  Use the Atrovent nasal spray, 2 squirts in each nostril every 6 hours as needed for nasal congestion.  Place a hot water bottle, or heating pad, underneath your pillowcase at night to help dilate up your ear and aid in pain relief as well as resolution of the infection.  Return for reevaluation for any new or worsening symptoms.      ED Prescriptions     Medication Sig Dispense Auth. Provider   clindamycin (CLEOCIN) 150 MG capsule Take 2 capsules (300 mg total) by mouth 3 (three) times daily for 7 days. 42 capsule Becky Augusta, NP   ipratropium (ATROVENT) 0.06 % nasal spray Place 2 sprays into both nostrils 4 (four) times daily. 15 mL Becky Augusta, NP      PDMP not reviewed this encounter.   Becky Augusta, NP 08/05/21 1904

## 2021-08-05 NOTE — Discharge Instructions (Signed)
Take the Clindamycin 3 times daily for 7 days with food for treatment of your ear infection.  Take an over-the-counter probiotic 1 hour after each dose of antibiotic to prevent diarrhea.  Use over-the-counter Tylenol and ibuprofen as needed for pain or fever.  Use the Atrovent nasal spray, 2 squirts in each nostril every 6 hours as needed for nasal congestion.  Place a hot water bottle, or heating pad, underneath your pillowcase at night to help dilate up your ear and aid in pain relief as well as resolution of the infection.  Return for reevaluation for any new or worsening symptoms.

## 2021-08-05 NOTE — ED Triage Notes (Signed)
Pt here with C/O bilateral ear pain for 2 days. Nasal congestion.

## 2022-05-19 ENCOUNTER — Ambulatory Visit (INDEPENDENT_AMBULATORY_CARE_PROVIDER_SITE_OTHER): Payer: Medicaid Other

## 2022-05-19 ENCOUNTER — Ambulatory Visit
Admission: EM | Admit: 2022-05-19 | Discharge: 2022-05-19 | Disposition: A | Discharge status: Home / Self Care | Payer: Medicaid Other | Attending: Internal Medicine | Admitting: Internal Medicine

## 2022-05-19 ENCOUNTER — Encounter: Payer: Self-pay | Admitting: Emergency Medicine

## 2022-05-19 DIAGNOSIS — Z87448 Personal history of other diseases of urinary system: Secondary | ICD-10-CM | POA: Insufficient documentation

## 2022-05-19 DIAGNOSIS — R109 Unspecified abdominal pain: Secondary | ICD-10-CM | POA: Insufficient documentation

## 2022-05-19 DIAGNOSIS — N3001 Acute cystitis with hematuria: Secondary | ICD-10-CM | POA: Insufficient documentation

## 2022-05-19 LAB — URINALYSIS, ROUTINE W REFLEX MICROSCOPIC
Glucose, UA: NEGATIVE mg/dL
Ketones, ur: NEGATIVE mg/dL
Nitrite: NEGATIVE
Protein, ur: 100 mg/dL — AB
Specific Gravity, Urine: 1.02 (ref 1.005–1.030)
pH: 6 (ref 5.0–8.0)

## 2022-05-19 LAB — URINALYSIS, MICROSCOPIC (REFLEX): RBC / HPF: >50 RBC/hpf (ref 0–5)

## 2022-05-19 LAB — PREGNANCY, URINE: Preg Test, Ur: NEGATIVE

## 2022-05-19 MED ORDER — ONDANSETRON 8 MG PO TBDP: 8.0000 mg | ORAL_TABLET | Freq: Three times a day (TID) | ORAL | 0 refills | Status: DC | PRN

## 2022-05-19 MED ORDER — HYDROCODONE-ACETAMINOPHEN 5-325 MG PO TABS: 1.0000 | ORAL_TABLET | Freq: Four times a day (QID) | ORAL | 0 refills | Status: DC | PRN

## 2022-05-19 MED ORDER — TAMSULOSIN HCL 0.4 MG PO CAPS: 0.4000 mg | ORAL_CAPSULE | Freq: Every day | ORAL | 0 refills | Status: DC

## 2022-05-19 MED ORDER — CIPROFLOXACIN HCL 500 MG PO TABS: 500.0000 mg | ORAL_TABLET | Freq: Two times a day (BID) | ORAL | 0 refills | Status: DC

## 2022-05-19 MED ORDER — ONDANSETRON 8 MG PO TBDP
8.0000 mg | ORAL_TABLET | Freq: Once | ORAL | Status: AC
  Administered 2022-05-19: 8 mg via ORAL

## 2022-05-19 NOTE — ED Triage Notes (Signed)
Pt presents with right side lower back pain, pain after urinating and nausea x 3 days.

## 2022-05-19 NOTE — ED Provider Notes (Addendum)
[facility]    CSN: [id number] Arrival date & time: 05/19/22  1746      History   Chief Complaint Chief Complaint  Patient presents with   Dysuria   Back Pain    HPI Alicia Blevins is a 36 y.o. female who presents with R flank pain after she voids and suprapubic pressure and nausea x 3 days. She denies dysuria, abnormal vaginal discharge or vaginal itching. Has not been in a hot tub or swimming in a river. Has hx of renal stone and feels this way.     Past Medical History:  Diagnosis Date   Asthma    Hypertension    Polycystic disease, ovaries     There are no problems to display for this patient.   Past Surgical History:  Procedure Laterality Date   TUBAL LIGATION      OB History   No obstetric history on file.      Home Medications    Prior to Admission medications   Medication Sig Start Date End Date Taking? Authorizing Provider  ciprofloxacin (CIPRO) 500 MG tablet Take 1 tablet (500 mg total) by mouth 2 (two) times daily. 05/19/22  Yes Rodriguez-Southworth, Nettie Elm, PA-C  HYDROcodone-acetaminophen (NORCO/VICODIN) 5-325 MG tablet Take 1 tablet by mouth every 6 (six) hours as needed. 05/19/22  Yes Rodriguez-Southworth, Nettie Elm, PA-C  ondansetron (ZOFRAN-ODT) 8 MG disintegrating tablet Take 1 tablet (8 mg total) by mouth every 8 (eight) hours as needed for nausea or vomiting. 05/19/22  Yes Rodriguez-Southworth, Nettie Elm, PA-C  tamsulosin (FLOMAX) 0.4 MG CAPS capsule Take 1 capsule (0.4 mg total) by mouth daily. 05/19/22  Yes Rodriguez-Southworth, Nettie Elm, PA-C  ipratropium (ATROVENT) 0.06 % nasal spray Place 2 sprays into both nostrils 4 (four) times daily. 08/05/21   Becky Augusta, NP    Family History History reviewed. No pertinent family history.  Social History Social History   Tobacco Use   Smoking status: Never   Smokeless tobacco: Never  Vaping Use   Vaping Use: Some days  Substance Use Topics   Alcohol use: Yes   Drug use: No      Allergies   Penicillins, Red dye, Amoxicillin, and Tramadol   Review of Systems Review of Systems  Constitutional:  Positive for chills. Negative for diaphoresis and fever.  Gastrointestinal:  Positive for abdominal pain and nausea.  Genitourinary:  Positive for flank pain. Negative for dysuria and frequency.     Physical Exam Triage Vital Signs ED Triage Vitals  Enc Vitals Group     BP 05/19/22 1759 (!) 172/132     Pulse Rate 05/19/22 1759 87     Resp 05/19/22 1759 18     Temp 05/19/22 1759 99.2 F (37.3 C)     Temp Source 05/19/22 1759 Oral     SpO2 05/19/22 1759 97 %     Weight --      Height --      Head Circumference --      Peak Flow --      Pain Score 05/19/22 1758 10     Pain Loc --      Pain Edu? --      Excl. in GC? --    No data found.  Updated Vital Signs BP (!) 154/121 (BP Location: Left Wrist)   Pulse 87   Temp 99.2 F (37.3 C) (Oral)   Resp 18   LMP 05/18/2022 Comment: denies preg, signed waiver, tubaligation  SpO2 97%   Visual Acuity Right  Eye Distance:   Left Eye Distance:   Bilateral Distance:    Right Eye Near:   Left Eye Near:    Bilateral Near:     Physical Exam Physical Exam Vitals and nursing note reviewed.  Constitutional:      General: She is not in acute distress.    Appearance: She is not toxic-appearing.  HENT:     Head: Normocephalic.     Right Ear: External ear normal.     Left Ear: External ear normal.  Eyes:     General: No scleral icterus.    Conjunctiva/sclera: Conjunctivae normal.  Pulmonary:     Effort: Pulmonary effort is normal.  Abdominal:     General: Bowel sounds are normal.     Palpations: Abdomen is soft. There is no mass.     Tenderness: There is no guarding or rebound.     Comments: R +CVA tenderness   Musculoskeletal:        General: Normal range of motion.     Cervical back: Neck supple.     Comments: BACK- has muscular tenderness on area of complaint with palpation  Skin:    General:  Skin is warm and dry.     Findings: No rash.  Neurological:     Mental Status: She is alert and oriented to person, place, and time.     Gait: Gait normal.  Psychiatric:        Mood and Affect: Mood normal.        Behavior: Behavior normal.        Thought Content: Thought content normal.        Judgment: Judgment normal.    UC Treatments / Results  Labs (all labs ordered are listed, but only abnormal results are displayed) Labs Reviewed  URINALYSIS, ROUTINE W REFLEX MICROSCOPIC - Abnormal; Notable for the following components:      Result Value   Color, Urine RED (*)    APPearance HAZY (*)    Hgb urine dipstick LARGE (*)    Bilirubin Urine SMALL (*)    Protein, ur 100 (*)    Leukocytes,Ua SMALL (*)    All other components within normal limits  URINALYSIS, MICROSCOPIC (REFLEX) - Abnormal; Notable for the following components:   Bacteria, UA FEW (*)    Trichomonas, UA PRESENT (*)    All other components within normal limits  PREGNANCY, URINE    EKG   Radiology DG Abd 1 View  Result Date: 05/19/2022 CLINICAL DATA:  Right flank pain.  History of kidney stones. EXAM: ABDOMEN - 1 VIEW COMPARISON:  CT AP 06/23/2012 FINDINGS: Calcification in the upper pole of the right kidney measures 2 mm. Within the pelvis there is a small calcification, in the expected location of the distal right ureter/bladder measuring 2-3 mm. Bowel gas pattern appears unremarkable. Visualized osseous structures are intact. IMPRESSION: 1. Small right renal calculus. 2. Small calcification in the expected location of the distal right ureter/bladder measuring 2-3 mm. Small distal right ureteral calculus cannot be excluded. 3. Normal bowel gas pattern. Electronically Signed   By: Signa Kell M.D.   On: 05/19/2022 18:56    Procedures Procedures (including critical care time)  Medications Ordered in UC Medications  ondansetron (ZOFRAN-ODT) disintegrating tablet 8 mg (8 mg Oral Given 05/19/22 1834)     Initial Impression / Assessment and Plan / UC Course  I have reviewed the triage vital signs and the nursing notes.  Pertinent labs & imaging results that were  available during my care of the patient were reviewed by me and considered in my medical decision making (see chart for details). She was told her urine shows trichomonas. She can Fu with PCP about this.   Renal colic Possible UTI  I placed her on Cipro, Flomax, and Norco as noted. If she gets worse, needs to go to ER for CT.   Final Clinical Impressions(s) / UC Diagnoses   Final diagnoses:  Acute cystitis with hematuria  Right flank pain  History of renal colic     Discharge Instructions      I will place you on antibiotics to cover infection, Flomax to help you pass a possible stone, and something for pain. If in 24-48 hours you get worse, then go to ER to have a CT scan.   Monitor your blood pressure when the pain gets better, it needs to be less than 140/90     ED Prescriptions     Medication Sig Dispense Auth. Provider   ondansetron (ZOFRAN-ODT) 8 MG disintegrating tablet Take 1 tablet (8 mg total) by mouth every 8 (eight) hours as needed for nausea or vomiting. 20 tablet Rodriguez-Southworth, Nettie Elm, PA-C   HYDROcodone-acetaminophen (NORCO/VICODIN) 5-325 MG tablet Take 1 tablet by mouth every 6 (six) hours as needed. 10 tablet Rodriguez-Southworth, Nettie Elm, PA-C   tamsulosin (FLOMAX) 0.4 MG CAPS capsule Take 1 capsule (0.4 mg total) by mouth daily. 10 capsule Rodriguez-Southworth, Nettie Elm, PA-C   ciprofloxacin (CIPRO) 500 MG tablet Take 1 tablet (500 mg total) by mouth 2 (two) times daily. 14 tablet Rodriguez-Southworth, Nettie Elm, PA-C      I have reviewed the PDMP during this encounter.   Garey Ham, PA-C 05/19/22 2016    Rodriguez-Southworth, [place], PA-C 05/19/22 2017

## 2022-05-19 NOTE — Discharge Instructions (Addendum)
I will place you on antibiotics to cover infection, Flomax to help you pass a possible stone, and something for pain. If in 24-48 hours you get worse, then go to ER to have a CT scan.   Monitor your blood pressure when the pain gets better, it needs to be less than 140/90

## 2023-03-03 ENCOUNTER — Ambulatory Visit
Admission: EM | Admit: 2023-03-03 | Discharge: 2023-03-03 | Disposition: A | Discharge status: Home / Self Care | Payer: Medicaid Other | Attending: Emergency Medicine | Admitting: Emergency Medicine

## 2023-03-03 DIAGNOSIS — H1032 Unspecified acute conjunctivitis, left eye: Secondary | ICD-10-CM

## 2023-03-03 DIAGNOSIS — I10 Essential (primary) hypertension: Secondary | ICD-10-CM | POA: Diagnosis not present

## 2023-03-03 MED ORDER — POLYMYXIN B-TRIMETHOPRIM 10000-0.1 UNIT/ML-% OP SOLN: 1.0000 [drp] | Freq: Four times a day (QID) | OPHTHALMIC | 0 refills | Status: AC

## 2023-03-03 NOTE — ED Provider Notes (Signed)
Renaldo Fiddler    CSN: [id number] Arrival date & time: 03/03/23  1335      History   Chief Complaint Chief Complaint  Patient presents with   Eye Problem    Possible pink eye - Entered by patient    HPI Alicia Blevins is a 37 y.o. female.  Patient presents with left eye redness, green drainage, and crusting in lashes this morning.  No eye injury.  She recently had congestion and runny nose but these symptoms improved.  She denies fever, eye pain, change in vision, cough, shortness of breath, chest pain, or other symptoms.  No treatments at home.  Her medical history includes hypertension and asthma.  The history is provided by the patient and medical records.    Past Medical History:  Diagnosis Date   Asthma    Hypertension    Polycystic disease, ovaries     There are no problems to display for this patient.   Past Surgical History:  Procedure Laterality Date   ADENOIDECTOMY     CESAREAN SECTION     x2   TONSILLECTOMY     TUBAL LIGATION     TYMPANOSTOMY TUBE PLACEMENT      OB History   No obstetric history on file.      Home Medications    Prior to Admission medications   Medication Sig Start Date End Date Taking? Authorizing Provider  trimethoprim-polymyxin b (POLYTRIM) ophthalmic solution Place 1 drop into both eyes 4 (four) times daily for 7 days. 03/03/23 03/10/23 Yes Mickie Bail, NP  ciprofloxacin (CIPRO) 500 MG tablet Take 1 tablet (500 mg total) by mouth 2 (two) times daily. Patient not taking: Reported on 03/03/2023 05/19/22   Rodriguez-Southworth, Nettie Elm, PA-C  HYDROcodone-acetaminophen (NORCO/VICODIN) 5-325 MG tablet Take 1 tablet by mouth every 6 (six) hours as needed. Patient not taking: Reported on 03/03/2023 05/19/22   Rodriguez-Southworth, Nettie Elm, PA-C  ipratropium (ATROVENT) 0.06 % nasal spray Place 2 sprays into both nostrils 4 (four) times daily. Patient not taking: Reported on 03/03/2023 08/05/21   Becky Augusta, NP  ondansetron  (ZOFRAN-ODT) 8 MG disintegrating tablet Take 1 tablet (8 mg total) by mouth every 8 (eight) hours as needed for nausea or vomiting. Patient not taking: Reported on 03/03/2023 05/19/22   Rodriguez-Southworth, Nettie Elm, PA-C  tamsulosin (FLOMAX) 0.4 MG CAPS capsule Take 1 capsule (0.4 mg total) by mouth daily. Patient not taking: Reported on 03/03/2023 05/19/22   Rodriguez-Southworth, Nettie Elm, PA-C    Family History History reviewed. No pertinent family history.  Social History Social History   Tobacco Use   Smoking status: Never   Smokeless tobacco: Never  Vaping Use   Vaping Use: Some days  Substance Use Topics   Alcohol use: Yes   Drug use: No     Allergies   Penicillins, Red dye, Amoxicillin, and Tramadol   Review of Systems Review of Systems  Constitutional:  Negative for chills and fever.  HENT:  Negative for ear pain and sore throat.   Eyes:  Positive for discharge, redness and itching. Negative for pain and visual disturbance.  Respiratory:  Negative for cough and shortness of breath.   Cardiovascular:  Negative for chest pain and palpitations.  Skin:  Negative for color change and rash.     Physical Exam Triage Vital Signs ED Triage Vitals  Enc Vitals Group     BP 03/03/23 1351 (!) 174/98     Pulse Rate 03/03/23 1347 (!) 108     Resp  03/03/23 1347 18     Temp 03/03/23 1347 97.9 F (36.6 C)     Temp src --      SpO2 03/03/23 1347 98 %     Weight --      Height --      Head Circumference --      Peak Flow --      Pain Score 03/03/23 1348 1     Pain Loc --      Pain Edu? --      Excl. in GC? --    No data found.  Updated Vital Signs BP (!) 145/95   Pulse (!) 108   Temp 97.9 F (36.6 C)   Resp 18   LMP 02/22/2023   SpO2 98%   Visual Acuity Right Eye Distance:   Left Eye Distance:   Bilateral Distance:    Right Eye Near:   Left Eye Near:    Bilateral Near:     Physical Exam Vitals and nursing note reviewed.  Constitutional:      General: She  is not in acute distress.    Appearance: She is well-developed. She is obese. She is not ill-appearing.  HENT:     Right Ear: Tympanic membrane normal.     Left Ear: Tympanic membrane normal.     Nose: Nose normal.     Mouth/Throat:     Mouth: Mucous membranes are moist.     Pharynx: Oropharynx is clear.  Eyes:     General: Lids are normal. Vision grossly intact.     Extraocular Movements: Extraocular movements intact.     Conjunctiva/sclera:     Left eye: Left conjunctiva is injected.     Pupils: Pupils are equal, round, and reactive to light.  Cardiovascular:     Rate and Rhythm: Normal rate and regular rhythm.     Heart sounds: Normal heart sounds.  Pulmonary:     Effort: Pulmonary effort is normal. No respiratory distress.     Breath sounds: Normal breath sounds.  Musculoskeletal:     Cervical back: Neck supple.  Skin:    General: Skin is warm and dry.  Neurological:     Mental Status: She is alert.  Psychiatric:        Mood and Affect: Mood normal.        Behavior: Behavior normal.      UC Treatments / Results  Labs (all labs ordered are listed, but only abnormal results are displayed) Labs Reviewed - No data to display  EKG   Radiology No results found.  Procedures Procedures (including critical care time)  Medications Ordered in UC Medications - No data to display  Initial Impression / Assessment and Plan / UC Course  I have reviewed the triage vital signs and the nursing notes.  Pertinent labs & imaging results that were available during my care of the patient were reviewed by me and considered in my medical decision making (see chart for details).    Acute conjunctivitis of left eye.  Elevated blood pressure reading with hypertension.  Treating conjunctivitis with Polytrim eyedrops.  Education provided on bacterial conjunctivitis.  Also discussed with patient that her blood pressure is elevated today and needs to be rechecked by a PCP in 1-2 weeks.   Education provided on managing hypertension.  She does not currently have a PCP and declines assistance with scheduling appointment.  She plans to call her mother's PCP to schedule an appointment to establish there.  She agrees  to plan of care.   Final Clinical Impressions(s) / UC Diagnoses   Final diagnoses:  Acute bacterial conjunctivitis of left eye  Elevated blood pressure reading in office with diagnosis of hypertension     Discharge Instructions      Use the antibiotic eyedrops as prescribed.    Follow-up with a primary care provider if your symptoms are not improving.    Your blood pressure is elevated today at 174/98; recheck 145/95.  Please have this rechecked by a primary care provider in 1-2 weeks.          ED Prescriptions     Medication Sig Dispense Auth. Provider   trimethoprim-polymyxin b (POLYTRIM) ophthalmic solution Place 1 drop into both eyes 4 (four) times daily for 7 days. 10 mL Mickie Bail, NP      PDMP not reviewed this encounter.   Mickie Bail, NP 03/03/23 1426

## 2023-03-03 NOTE — Discharge Instructions (Addendum)
Use the antibiotic eyedrops as prescribed.    Follow-up with a primary care provider if your symptoms are not improving.    Your blood pressure is elevated today at 174/98; recheck 145/95.  Please have this rechecked by a primary care provider in 1-2 weeks.

## 2023-03-03 NOTE — ED Triage Notes (Signed)
Patient to Urgent Care with complaints of left sided eye watering/ burning/ redness. Patient woke up this morning with a matted eye.

## 2024-01-04 ENCOUNTER — Ambulatory Visit
Admission: RE | Admit: 2024-01-04 | Discharge: 2024-01-04 | Disposition: A | Discharge status: Home / Self Care | Source: Ambulatory Visit | Attending: Emergency Medicine | Admitting: Emergency Medicine

## 2024-01-04 VITALS — BP 172/133 | HR 111 | Temp 99.2°F | Resp 18

## 2024-01-04 DIAGNOSIS — K047 Periapical abscess without sinus: Secondary | ICD-10-CM

## 2024-01-04 MED ORDER — CLINDAMYCIN HCL 300 MG PO CAPS: 300.0000 mg | ORAL_CAPSULE | Freq: Three times a day (TID) | ORAL | 0 refills | Status: AC

## 2024-01-04 NOTE — Discharge Instructions (Addendum)
 Take the clindamycin 300 mg 3 times a day with food for 10 days for treatment of your dental infection.  Use over-the-counter Tylenol and ibuprofen for swelling and mild to moderate pain.  Rinse with warm salt water, or Listerine, after each meal to remove food particles and wash away any pus that is collecting.  If you develop any increasing or swelling, fever, pain, or difficulty swallowing you to go to the emergency department at [facility] with a have an oral surgeon and also a dentist on-call.

## 2024-01-04 NOTE — ED Provider Notes (Signed)
 [facility]    CSN: [id number] Arrival date & time: 01/04/24  1755      History   Chief Complaint Chief Complaint  Patient presents with   Abscess    Tooth abscess, was seen and told if it gets worse or anything to come back since I cant get into the dentist for about 2 months - Entered by patient    HPI Alicia Blevins is a 38 y.o. female.   HPI  38 year old female with past medical history significant for polycystic ovarian syndrome, hypertension, asthma presents for evaluation of swelling to the left side of her mandible that started today along with pain on the left lower jaw.  She denies any fever or drainage.  Past Medical History:  Diagnosis Date   Asthma    Hypertension    Polycystic disease, ovaries     There are no active problems to display for this patient.   Past Surgical History:  Procedure Laterality Date   ADENOIDECTOMY     CESAREAN SECTION     x2   TONSILLECTOMY     TUBAL LIGATION     TYMPANOSTOMY TUBE PLACEMENT      OB History   No obstetric history on file.      Home Medications    Prior to Admission medications   Medication Sig Start Date End Date Taking? Authorizing Provider  clindamycin (CLEOCIN) 300 MG capsule Take 1 capsule (300 mg total) by mouth 3 (three) times daily for 10 days. 01/04/24 01/14/24 Yes Becky Augusta, NP    Family History History reviewed. No pertinent family history.  Social History Social History   Tobacco Use   Smoking status: Never   Smokeless tobacco: Never  Vaping Use   Vaping status: Some Days  Substance Use Topics   Alcohol use: Yes   Drug use: No     Allergies   Penicillins, Red dye #40 (allura red), Amoxicillin, Tramadol, and Tylenol [acetaminophen]   Review of Systems Review of Systems  Constitutional:  Negative for fever.  HENT:  Positive for dental problem and facial swelling.      Physical Exam Triage Vital Signs ED Triage Vitals  Encounter Vitals Group     BP       Systolic BP Percentile      Diastolic BP Percentile      Pulse      Resp      Temp      Temp src      SpO2      Weight      Height      Head Circumference      Peak Flow      Pain Score      Pain Loc      Pain Education      Exclude from Growth Chart    No data found.  Updated Vital Signs BP (!) 172/133 (BP Location: Right Arm)   Pulse (!) 111   Temp 99.2 F (37.3 C) (Oral)   Resp 18   LMP 11/19/2023 (Approximate)   SpO2 98%   Visual Acuity Right Eye Distance:   Left Eye Distance:   Bilateral Distance:    Right Eye Near:   Left Eye Near:    Bilateral Near:     Physical Exam Vitals and nursing note reviewed.  Constitutional:      Appearance: Normal appearance. She is not ill-appearing.  HENT:     Head: Normocephalic and atraumatic.  Mouth/Throat:     Mouth: Mucous membranes are moist.     Pharynx: Oropharyngeal exudate and posterior oropharyngeal erythema present.     Comments: The gumline on the buccal side of the left mandible is erythematous and there is white discharge coming from around the left lower canine back through the third molar.  None of the teeth are tender to percussion.  Patient does have visible and palpable swelling to the left side of the mandible without fluctuance, induration, or erythema. Neurological:     Mental Status: She is alert.      UC Treatments / Results  Labs (all labs ordered are listed, but only abnormal results are displayed) Labs Reviewed - No data to display  EKG   Radiology No results found.  Procedures Procedures (including critical care time)  Medications Ordered in UC Medications - No data to display  Initial Impression / Assessment and Plan / UC Course  I have reviewed the triage vital signs and the nursing notes.  Pertinent labs & imaging results that were available during my care of the patient were reviewed by me and considered in my medical decision making (see chart for details).   Patient is  a pleasant, nontoxic-appearing 38 year old female presenting for evaluation of left-sided facial swelling and possible dental abscess as outlined HPI above.  Her physical exam does reveal visible and palpable swelling to the left side of the mandible but no fluctuance or induration.  Also no erythema.  Patient does have erythema to the gum tissue on the buccal side of her mandible with white pus discharge from around all of her teeth.  None of her teeth are tender to percussion.  Patient was seen previously at [facility] urgent care and treated with a 10-day course of clindamycin.  She does have a red dye allergy but has tolerated clindamycin in the past.  I will place her on another 10-day course of clindamycin 300 mg 3 times daily with food.  She is awaiting on a call back to a dentist for evaluation.  I also suggested that she look into [facility] or [facility].  If she develops any increased facial swelling, pain, or fever she should go to the emergency department at [facility] to be evaluated by the dentist or oral surgeon on-call.   Final Clinical Impressions(s) / UC Diagnoses   Final diagnoses:  Dental abscess     Discharge Instructions      Take the clindamycin 300 mg 3 times a day with food for 10 days for treatment of your dental infection.  Use over-the-counter Tylenol and ibuprofen for swelling and mild to moderate pain.  Rinse with warm salt water, or Listerine, after each meal to remove food particles and wash away any pus that is collecting.  If you develop any increasing or swelling, fever, pain, or difficulty swallowing you to go to the emergency department at [facility] with a have an oral surgeon and also a dentist on-call.      ED Prescriptions     Medication Sig Dispense Auth. Provider   clindamycin (CLEOCIN) 300 MG capsule Take 1 capsule (300 mg total) by mouth 3 (three) times daily for 10 days. 30 capsule Becky Augusta, NP      PDMP not  reviewed this encounter.   Becky Augusta, NP 01/04/24 [phone]

## 2024-01-04 NOTE — ED Triage Notes (Signed)
 Patient presents to [facility] for tooth abscess. Was seen at [facility] for left sided tooth abscess. Unable to see dentist until 2-3 months. Treating pain with ibuprofen.
